# Patient Record
Sex: Female | Born: 1940 | Race: White | Hispanic: No | Marital: Married | Smoking: Former smoker
Health system: Southern US, Community
[De-identification: ages and names within clinical notes are randomized; demographics above are authoritative.]

## PROBLEM LIST (undated history)

## (undated) DIAGNOSIS — T7840XA Allergy, unspecified, initial encounter: Secondary | ICD-10-CM

## (undated) DIAGNOSIS — J45909 Unspecified asthma, uncomplicated: Secondary | ICD-10-CM

## (undated) DIAGNOSIS — D649 Anemia, unspecified: Secondary | ICD-10-CM

## (undated) DIAGNOSIS — M25562 Pain in left knee: Secondary | ICD-10-CM

## (undated) DIAGNOSIS — F329 Major depressive disorder, single episode, unspecified: Secondary | ICD-10-CM

## (undated) DIAGNOSIS — F32A Depression, unspecified: Secondary | ICD-10-CM

## (undated) DIAGNOSIS — I10 Essential (primary) hypertension: Secondary | ICD-10-CM

## (undated) DIAGNOSIS — E7800 Pure hypercholesterolemia, unspecified: Secondary | ICD-10-CM

## (undated) DIAGNOSIS — N189 Chronic kidney disease, unspecified: Secondary | ICD-10-CM

## (undated) DIAGNOSIS — K219 Gastro-esophageal reflux disease without esophagitis: Secondary | ICD-10-CM

## (undated) DIAGNOSIS — C4490 Unspecified malignant neoplasm of skin, unspecified: Secondary | ICD-10-CM

## (undated) DIAGNOSIS — M1990 Unspecified osteoarthritis, unspecified site: Secondary | ICD-10-CM

## (undated) HISTORY — PX: ABDOMINAL HYSTERECTOMY: SHX81

## (undated) HISTORY — PX: TONSILLECTOMY: SUR1361

---
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
## 1966-06-30 HISTORY — PX: GASTRECTOMY: SHX58

## 1995-08-18 HISTORY — PX: BREAST BIOPSY: SHX20

## 2004-12-26 ENCOUNTER — Ambulatory Visit: Payer: Self-pay | Admitting: Internal Medicine

## 2006-01-06 ENCOUNTER — Ambulatory Visit: Payer: Self-pay | Admitting: Internal Medicine

## 2006-01-14 ENCOUNTER — Ambulatory Visit: Payer: Self-pay | Admitting: Internal Medicine

## 2006-07-20 ENCOUNTER — Ambulatory Visit: Payer: Self-pay | Admitting: Internal Medicine

## 2007-07-26 ENCOUNTER — Ambulatory Visit: Payer: Self-pay | Admitting: Internal Medicine

## 2007-08-26 ENCOUNTER — Ambulatory Visit: Payer: Self-pay | Admitting: Gastroenterology

## 2008-07-27 ENCOUNTER — Ambulatory Visit: Payer: Self-pay | Admitting: Internal Medicine

## 2009-07-30 ENCOUNTER — Ambulatory Visit: Payer: Self-pay | Admitting: Internal Medicine

## 2010-07-11 ENCOUNTER — Emergency Department: Payer: Self-pay | Admitting: Unknown Physician Specialty

## 2010-07-31 ENCOUNTER — Ambulatory Visit: Payer: Self-pay | Admitting: Internal Medicine

## 2010-08-08 ENCOUNTER — Ambulatory Visit: Payer: Self-pay | Admitting: Gastroenterology

## 2010-09-12 ENCOUNTER — Ambulatory Visit: Payer: Self-pay | Admitting: Gastroenterology

## 2011-08-08 ENCOUNTER — Ambulatory Visit: Payer: Self-pay | Admitting: Internal Medicine

## 2012-08-10 ENCOUNTER — Ambulatory Visit: Payer: Self-pay | Admitting: Internal Medicine

## 2012-09-04 ENCOUNTER — Ambulatory Visit: Payer: Self-pay | Admitting: Orthopedic Surgery

## 2012-09-04 ENCOUNTER — Emergency Department: Payer: Self-pay | Admitting: Emergency Medicine

## 2013-05-06 ENCOUNTER — Ambulatory Visit: Payer: Self-pay | Admitting: Otolaryngology

## 2013-08-11 ENCOUNTER — Ambulatory Visit: Payer: Self-pay | Admitting: Internal Medicine

## 2013-12-10 DIAGNOSIS — L309 Dermatitis, unspecified: Secondary | ICD-10-CM | POA: Insufficient documentation

## 2013-12-10 DIAGNOSIS — I129 Hypertensive chronic kidney disease with stage 1 through stage 4 chronic kidney disease, or unspecified chronic kidney disease: Secondary | ICD-10-CM | POA: Insufficient documentation

## 2013-12-10 DIAGNOSIS — E781 Pure hyperglyceridemia: Secondary | ICD-10-CM | POA: Insufficient documentation

## 2013-12-10 DIAGNOSIS — J45909 Unspecified asthma, uncomplicated: Secondary | ICD-10-CM | POA: Insufficient documentation

## 2014-06-03 DIAGNOSIS — F325 Major depressive disorder, single episode, in full remission: Secondary | ICD-10-CM | POA: Insufficient documentation

## 2014-08-24 ENCOUNTER — Ambulatory Visit: Payer: Self-pay | Admitting: Internal Medicine

## 2014-10-20 NOTE — Consult Note (Signed)
PATIENT NAME:  Christina Bird, Christina Bird MR#:  [id number] DATE OF BIRTH:  11-13-1940  DATE OF CONSULTATION:  09/04/2012  REFERRING PHYSICIAN:  Emergency Room.   CONSULTING PHYSICIAN:  Claud Kelp, MD  HISTORY OF PRESENT ILLNESS:  She is a relatively healthy RHD 74 year old female who slipped and fell on her left outstretched hand earlier today and noted immediate pain, swelling and deformity about her left wrist. She presented to the Emergency Room for further evaluation.    FAMILY AND SOCIAL HISTORY:  Noncontributory.  REVIEW OF SYSTEMS:  Negative for any nausea, vomiting, fevers, chills, shortness of breath or chest pain.  PHYSICAL EXAMINATION:  She had a notable dorsal deformity about her left wrist. She had intact sensation to all her digits, was able to flex and extend all fingers, and she had brisk capillary refill to all digits. She also had palpable radial and ulnar pulses to her left hand. She has no tenderness to palpation about her forearm, elbow or shoulder. She had painless range of motion of her elbow and shoulder.   Radiographs taken demonstrated a distal radius fracture with dorsal angulation displacement.   ASSESSMENT:  A 74 year old female with a left distal radius fracture.   PLAN:  The patient was counseled regarding the risks and benefits of closed reduction and splinting. She was also counseled that the reduction may not be able to be maintained. She will need serial radiographs to make sure she maintains her reduction and is able to manage with further nonoperative management versus the potential for the loss of reduction and need for surgical intervention. She agreed to proceed with closed reduction after this discussion. The patient was given a conscious sedation and distal radius reduction was performed. A sugar tong splint was applied, and post reduction films demonstrated adequate restoration of radial height and alignment. The patient will be discharged home. Follow up in one  week for repeat radiographic evaluation.   ____________________________ Claud Kelp, MD tte:jm D: 09/04/2012 13:01:03 ET T: 09/04/2012 13:34:10 ET JOB#: [id number]  cc: Claud Kelp, MD, <Dictator> Claud Kelp MD ELECTRONICALLY SIGNED 09/05/2012 8:54

## 2014-12-14 DIAGNOSIS — G43009 Migraine without aura, not intractable, without status migrainosus: Secondary | ICD-10-CM | POA: Insufficient documentation

## 2015-07-30 ENCOUNTER — Other Ambulatory Visit: Payer: Self-pay | Admitting: Internal Medicine

## 2015-07-30 DIAGNOSIS — Z1231 Encounter for screening mammogram for malignant neoplasm of breast: Secondary | ICD-10-CM

## 2015-08-30 ENCOUNTER — Other Ambulatory Visit: Payer: Self-pay | Admitting: Internal Medicine

## 2015-08-30 ENCOUNTER — Ambulatory Visit
Admission: RE | Admit: 2015-08-30 | Discharge: 2015-08-30 | Disposition: A | Discharge status: Home / Self Care | Payer: PPO | Source: Ambulatory Visit | Attending: Internal Medicine | Admitting: Internal Medicine

## 2015-08-30 DIAGNOSIS — Z1231 Encounter for screening mammogram for malignant neoplasm of breast: Secondary | ICD-10-CM | POA: Diagnosis not present

## 2015-11-15 DIAGNOSIS — L820 Inflamed seborrheic keratosis: Secondary | ICD-10-CM | POA: Diagnosis not present

## 2015-11-15 DIAGNOSIS — X32XXXA Exposure to sunlight, initial encounter: Secondary | ICD-10-CM | POA: Diagnosis not present

## 2015-11-15 DIAGNOSIS — L570 Actinic keratosis: Secondary | ICD-10-CM | POA: Diagnosis not present

## 2015-11-15 DIAGNOSIS — I821 Thrombophlebitis migrans: Secondary | ICD-10-CM | POA: Diagnosis not present

## 2015-11-15 DIAGNOSIS — Z85828 Personal history of other malignant neoplasm of skin: Secondary | ICD-10-CM | POA: Diagnosis not present

## 2015-11-15 DIAGNOSIS — Z08 Encounter for follow-up examination after completed treatment for malignant neoplasm: Secondary | ICD-10-CM | POA: Diagnosis not present

## 2015-11-15 DIAGNOSIS — D485 Neoplasm of uncertain behavior of skin: Secondary | ICD-10-CM | POA: Diagnosis not present

## 2015-12-13 DIAGNOSIS — I129 Hypertensive chronic kidney disease with stage 1 through stage 4 chronic kidney disease, or unspecified chronic kidney disease: Secondary | ICD-10-CM | POA: Diagnosis not present

## 2015-12-13 DIAGNOSIS — E781 Pure hyperglyceridemia: Secondary | ICD-10-CM | POA: Diagnosis not present

## 2015-12-13 DIAGNOSIS — N183 Chronic kidney disease, stage 3 (moderate): Secondary | ICD-10-CM | POA: Diagnosis not present

## 2015-12-20 DIAGNOSIS — J4520 Mild intermittent asthma, uncomplicated: Secondary | ICD-10-CM | POA: Diagnosis not present

## 2015-12-20 DIAGNOSIS — F325 Major depressive disorder, single episode, in full remission: Secondary | ICD-10-CM | POA: Diagnosis not present

## 2015-12-20 DIAGNOSIS — K219 Gastro-esophageal reflux disease without esophagitis: Secondary | ICD-10-CM | POA: Diagnosis not present

## 2015-12-20 DIAGNOSIS — N183 Chronic kidney disease, stage 3 (moderate): Secondary | ICD-10-CM | POA: Diagnosis not present

## 2015-12-20 DIAGNOSIS — E781 Pure hyperglyceridemia: Secondary | ICD-10-CM | POA: Diagnosis not present

## 2015-12-20 DIAGNOSIS — Z79899 Other long term (current) drug therapy: Secondary | ICD-10-CM | POA: Diagnosis not present

## 2015-12-20 DIAGNOSIS — I129 Hypertensive chronic kidney disease with stage 1 through stage 4 chronic kidney disease, or unspecified chronic kidney disease: Secondary | ICD-10-CM | POA: Diagnosis not present

## 2016-03-16 DIAGNOSIS — H6981 Other specified disorders of Eustachian tube, right ear: Secondary | ICD-10-CM | POA: Diagnosis not present

## 2016-04-05 DIAGNOSIS — J209 Acute bronchitis, unspecified: Secondary | ICD-10-CM | POA: Diagnosis not present

## 2016-04-10 DIAGNOSIS — Z23 Encounter for immunization: Secondary | ICD-10-CM | POA: Diagnosis not present

## 2016-04-11 DIAGNOSIS — H2513 Age-related nuclear cataract, bilateral: Secondary | ICD-10-CM | POA: Diagnosis not present

## 2016-05-11 DIAGNOSIS — R05 Cough: Secondary | ICD-10-CM | POA: Diagnosis not present

## 2016-05-11 DIAGNOSIS — J4541 Moderate persistent asthma with (acute) exacerbation: Secondary | ICD-10-CM | POA: Diagnosis not present

## 2016-05-21 DIAGNOSIS — J4521 Mild intermittent asthma with (acute) exacerbation: Secondary | ICD-10-CM | POA: Diagnosis not present

## 2016-06-19 DIAGNOSIS — I129 Hypertensive chronic kidney disease with stage 1 through stage 4 chronic kidney disease, or unspecified chronic kidney disease: Secondary | ICD-10-CM | POA: Diagnosis not present

## 2016-06-19 DIAGNOSIS — F325 Major depressive disorder, single episode, in full remission: Secondary | ICD-10-CM | POA: Diagnosis not present

## 2016-06-19 DIAGNOSIS — N183 Chronic kidney disease, stage 3 (moderate): Secondary | ICD-10-CM | POA: Diagnosis not present

## 2016-06-19 DIAGNOSIS — K219 Gastro-esophageal reflux disease without esophagitis: Secondary | ICD-10-CM | POA: Diagnosis not present

## 2016-06-19 DIAGNOSIS — E781 Pure hyperglyceridemia: Secondary | ICD-10-CM | POA: Diagnosis not present

## 2016-06-19 DIAGNOSIS — Z79899 Other long term (current) drug therapy: Secondary | ICD-10-CM | POA: Diagnosis not present

## 2016-06-26 DIAGNOSIS — E538 Deficiency of other specified B group vitamins: Secondary | ICD-10-CM | POA: Insufficient documentation

## 2016-06-26 DIAGNOSIS — E781 Pure hyperglyceridemia: Secondary | ICD-10-CM | POA: Diagnosis not present

## 2016-06-26 DIAGNOSIS — F325 Major depressive disorder, single episode, in full remission: Secondary | ICD-10-CM | POA: Diagnosis not present

## 2016-06-26 DIAGNOSIS — I129 Hypertensive chronic kidney disease with stage 1 through stage 4 chronic kidney disease, or unspecified chronic kidney disease: Secondary | ICD-10-CM | POA: Diagnosis not present

## 2016-06-26 DIAGNOSIS — Z0000 Encounter for general adult medical examination without abnormal findings: Secondary | ICD-10-CM | POA: Diagnosis not present

## 2016-06-26 DIAGNOSIS — N183 Chronic kidney disease, stage 3 (moderate): Secondary | ICD-10-CM | POA: Diagnosis not present

## 2016-08-05 ENCOUNTER — Other Ambulatory Visit: Payer: Self-pay | Admitting: Internal Medicine

## 2016-08-05 DIAGNOSIS — Z1231 Encounter for screening mammogram for malignant neoplasm of breast: Secondary | ICD-10-CM

## 2016-08-21 DIAGNOSIS — L820 Inflamed seborrheic keratosis: Secondary | ICD-10-CM | POA: Diagnosis not present

## 2016-08-21 DIAGNOSIS — L538 Other specified erythematous conditions: Secondary | ICD-10-CM | POA: Diagnosis not present

## 2016-08-21 DIAGNOSIS — Z85828 Personal history of other malignant neoplasm of skin: Secondary | ICD-10-CM | POA: Diagnosis not present

## 2016-08-21 DIAGNOSIS — L570 Actinic keratosis: Secondary | ICD-10-CM | POA: Diagnosis not present

## 2016-08-21 DIAGNOSIS — D225 Melanocytic nevi of trunk: Secondary | ICD-10-CM | POA: Diagnosis not present

## 2016-08-21 DIAGNOSIS — X32XXXA Exposure to sunlight, initial encounter: Secondary | ICD-10-CM | POA: Diagnosis not present

## 2016-08-21 DIAGNOSIS — L821 Other seborrheic keratosis: Secondary | ICD-10-CM | POA: Diagnosis not present

## 2016-08-21 DIAGNOSIS — D2261 Melanocytic nevi of right upper limb, including shoulder: Secondary | ICD-10-CM | POA: Diagnosis not present

## 2016-09-04 ENCOUNTER — Ambulatory Visit
Admission: RE | Admit: 2016-09-04 | Discharge: 2016-09-04 | Disposition: A | Discharge status: Home / Self Care | Payer: PPO | Source: Ambulatory Visit | Attending: Internal Medicine | Admitting: Internal Medicine

## 2016-09-04 DIAGNOSIS — Z1231 Encounter for screening mammogram for malignant neoplasm of breast: Secondary | ICD-10-CM | POA: Diagnosis not present

## 2016-09-10 DIAGNOSIS — J209 Acute bronchitis, unspecified: Secondary | ICD-10-CM | POA: Diagnosis not present

## 2016-12-06 DIAGNOSIS — L255 Unspecified contact dermatitis due to plants, except food: Secondary | ICD-10-CM | POA: Diagnosis not present

## 2016-12-11 DIAGNOSIS — F325 Major depressive disorder, single episode, in full remission: Secondary | ICD-10-CM | POA: Diagnosis not present

## 2016-12-11 DIAGNOSIS — N183 Chronic kidney disease, stage 3 (moderate): Secondary | ICD-10-CM | POA: Diagnosis not present

## 2016-12-11 DIAGNOSIS — I129 Hypertensive chronic kidney disease with stage 1 through stage 4 chronic kidney disease, or unspecified chronic kidney disease: Secondary | ICD-10-CM | POA: Diagnosis not present

## 2016-12-11 DIAGNOSIS — E781 Pure hyperglyceridemia: Secondary | ICD-10-CM | POA: Diagnosis not present

## 2016-12-25 DIAGNOSIS — E781 Pure hyperglyceridemia: Secondary | ICD-10-CM | POA: Diagnosis not present

## 2016-12-25 DIAGNOSIS — E538 Deficiency of other specified B group vitamins: Secondary | ICD-10-CM | POA: Diagnosis not present

## 2016-12-25 DIAGNOSIS — I129 Hypertensive chronic kidney disease with stage 1 through stage 4 chronic kidney disease, or unspecified chronic kidney disease: Secondary | ICD-10-CM | POA: Diagnosis not present

## 2016-12-25 DIAGNOSIS — F325 Major depressive disorder, single episode, in full remission: Secondary | ICD-10-CM | POA: Diagnosis not present

## 2016-12-25 DIAGNOSIS — N183 Chronic kidney disease, stage 3 (moderate): Secondary | ICD-10-CM | POA: Diagnosis not present

## 2016-12-25 DIAGNOSIS — J4541 Moderate persistent asthma with (acute) exacerbation: Secondary | ICD-10-CM | POA: Diagnosis not present

## 2017-04-02 DIAGNOSIS — Z23 Encounter for immunization: Secondary | ICD-10-CM | POA: Diagnosis not present

## 2017-04-23 DIAGNOSIS — E538 Deficiency of other specified B group vitamins: Secondary | ICD-10-CM | POA: Diagnosis not present

## 2017-04-23 DIAGNOSIS — E781 Pure hyperglyceridemia: Secondary | ICD-10-CM | POA: Diagnosis not present

## 2017-04-23 DIAGNOSIS — Z0000 Encounter for general adult medical examination without abnormal findings: Secondary | ICD-10-CM | POA: Diagnosis not present

## 2017-04-23 DIAGNOSIS — F325 Major depressive disorder, single episode, in full remission: Secondary | ICD-10-CM | POA: Diagnosis not present

## 2017-04-23 DIAGNOSIS — N183 Chronic kidney disease, stage 3 (moderate): Secondary | ICD-10-CM | POA: Diagnosis not present

## 2017-04-23 DIAGNOSIS — I129 Hypertensive chronic kidney disease with stage 1 through stage 4 chronic kidney disease, or unspecified chronic kidney disease: Secondary | ICD-10-CM | POA: Diagnosis not present

## 2017-04-23 DIAGNOSIS — J4541 Moderate persistent asthma with (acute) exacerbation: Secondary | ICD-10-CM | POA: Diagnosis not present

## 2017-06-25 DIAGNOSIS — E781 Pure hyperglyceridemia: Secondary | ICD-10-CM | POA: Diagnosis not present

## 2017-06-25 DIAGNOSIS — N183 Chronic kidney disease, stage 3 (moderate): Secondary | ICD-10-CM | POA: Diagnosis not present

## 2017-06-25 DIAGNOSIS — I129 Hypertensive chronic kidney disease with stage 1 through stage 4 chronic kidney disease, or unspecified chronic kidney disease: Secondary | ICD-10-CM | POA: Diagnosis not present

## 2017-06-25 DIAGNOSIS — E538 Deficiency of other specified B group vitamins: Secondary | ICD-10-CM | POA: Diagnosis not present

## 2017-07-02 DIAGNOSIS — I129 Hypertensive chronic kidney disease with stage 1 through stage 4 chronic kidney disease, or unspecified chronic kidney disease: Secondary | ICD-10-CM | POA: Diagnosis not present

## 2017-07-02 DIAGNOSIS — N183 Chronic kidney disease, stage 3 (moderate): Secondary | ICD-10-CM | POA: Diagnosis not present

## 2017-07-02 DIAGNOSIS — E538 Deficiency of other specified B group vitamins: Secondary | ICD-10-CM | POA: Diagnosis not present

## 2017-07-02 DIAGNOSIS — K219 Gastro-esophageal reflux disease without esophagitis: Secondary | ICD-10-CM | POA: Diagnosis not present

## 2017-07-02 DIAGNOSIS — Z0000 Encounter for general adult medical examination without abnormal findings: Secondary | ICD-10-CM | POA: Diagnosis not present

## 2017-07-02 DIAGNOSIS — J4541 Moderate persistent asthma with (acute) exacerbation: Secondary | ICD-10-CM | POA: Diagnosis not present

## 2017-07-02 DIAGNOSIS — Z1231 Encounter for screening mammogram for malignant neoplasm of breast: Secondary | ICD-10-CM | POA: Diagnosis not present

## 2017-07-02 DIAGNOSIS — E781 Pure hyperglyceridemia: Secondary | ICD-10-CM | POA: Diagnosis not present

## 2017-07-02 DIAGNOSIS — F325 Major depressive disorder, single episode, in full remission: Secondary | ICD-10-CM | POA: Diagnosis not present

## 2017-08-05 ENCOUNTER — Other Ambulatory Visit: Payer: Self-pay | Admitting: Internal Medicine

## 2017-08-05 DIAGNOSIS — Z1231 Encounter for screening mammogram for malignant neoplasm of breast: Secondary | ICD-10-CM

## 2017-09-10 ENCOUNTER — Ambulatory Visit
Admission: RE | Admit: 2017-09-10 | Discharge: 2017-09-10 | Disposition: A | Discharge status: Home / Self Care | Payer: PPO | Source: Ambulatory Visit | Attending: Internal Medicine | Admitting: Internal Medicine

## 2017-09-10 DIAGNOSIS — Z1231 Encounter for screening mammogram for malignant neoplasm of breast: Secondary | ICD-10-CM

## 2017-10-29 DIAGNOSIS — D2262 Melanocytic nevi of left upper limb, including shoulder: Secondary | ICD-10-CM | POA: Diagnosis not present

## 2017-10-29 DIAGNOSIS — D2272 Melanocytic nevi of left lower limb, including hip: Secondary | ICD-10-CM | POA: Diagnosis not present

## 2017-10-29 DIAGNOSIS — D045 Carcinoma in situ of skin of trunk: Secondary | ICD-10-CM | POA: Diagnosis not present

## 2017-10-29 DIAGNOSIS — N183 Chronic kidney disease, stage 3 (moderate): Secondary | ICD-10-CM | POA: Diagnosis not present

## 2017-10-29 DIAGNOSIS — D485 Neoplasm of uncertain behavior of skin: Secondary | ICD-10-CM | POA: Diagnosis not present

## 2017-10-29 DIAGNOSIS — Z08 Encounter for follow-up examination after completed treatment for malignant neoplasm: Secondary | ICD-10-CM | POA: Diagnosis not present

## 2017-10-29 DIAGNOSIS — X32XXXA Exposure to sunlight, initial encounter: Secondary | ICD-10-CM | POA: Diagnosis not present

## 2017-10-29 DIAGNOSIS — D2261 Melanocytic nevi of right upper limb, including shoulder: Secondary | ICD-10-CM | POA: Diagnosis not present

## 2017-10-29 DIAGNOSIS — Z85828 Personal history of other malignant neoplasm of skin: Secondary | ICD-10-CM | POA: Diagnosis not present

## 2017-10-29 DIAGNOSIS — D225 Melanocytic nevi of trunk: Secondary | ICD-10-CM | POA: Diagnosis not present

## 2017-10-29 DIAGNOSIS — L821 Other seborrheic keratosis: Secondary | ICD-10-CM | POA: Diagnosis not present

## 2017-10-29 DIAGNOSIS — D0439 Carcinoma in situ of skin of other parts of face: Secondary | ICD-10-CM | POA: Diagnosis not present

## 2017-10-29 DIAGNOSIS — I129 Hypertensive chronic kidney disease with stage 1 through stage 4 chronic kidney disease, or unspecified chronic kidney disease: Secondary | ICD-10-CM | POA: Diagnosis not present

## 2017-10-29 DIAGNOSIS — D2271 Melanocytic nevi of right lower limb, including hip: Secondary | ICD-10-CM | POA: Diagnosis not present

## 2017-10-29 DIAGNOSIS — L570 Actinic keratosis: Secondary | ICD-10-CM | POA: Diagnosis not present

## 2017-11-22 DIAGNOSIS — R05 Cough: Secondary | ICD-10-CM | POA: Diagnosis not present

## 2017-11-22 DIAGNOSIS — J029 Acute pharyngitis, unspecified: Secondary | ICD-10-CM | POA: Diagnosis not present

## 2017-11-22 DIAGNOSIS — J019 Acute sinusitis, unspecified: Secondary | ICD-10-CM | POA: Diagnosis not present

## 2017-11-22 DIAGNOSIS — B9689 Other specified bacterial agents as the cause of diseases classified elsewhere: Secondary | ICD-10-CM | POA: Diagnosis not present

## 2017-12-03 DIAGNOSIS — H2513 Age-related nuclear cataract, bilateral: Secondary | ICD-10-CM | POA: Diagnosis not present

## 2017-12-24 DIAGNOSIS — D045 Carcinoma in situ of skin of trunk: Secondary | ICD-10-CM | POA: Diagnosis not present

## 2018-01-07 DIAGNOSIS — E781 Pure hyperglyceridemia: Secondary | ICD-10-CM | POA: Diagnosis not present

## 2018-01-07 DIAGNOSIS — I129 Hypertensive chronic kidney disease with stage 1 through stage 4 chronic kidney disease, or unspecified chronic kidney disease: Secondary | ICD-10-CM | POA: Diagnosis not present

## 2018-01-07 DIAGNOSIS — N183 Chronic kidney disease, stage 3 (moderate): Secondary | ICD-10-CM | POA: Diagnosis not present

## 2018-01-07 DIAGNOSIS — E538 Deficiency of other specified B group vitamins: Secondary | ICD-10-CM | POA: Diagnosis not present

## 2018-01-14 DIAGNOSIS — E538 Deficiency of other specified B group vitamins: Secondary | ICD-10-CM | POA: Diagnosis not present

## 2018-01-14 DIAGNOSIS — N183 Chronic kidney disease, stage 3 (moderate): Secondary | ICD-10-CM | POA: Diagnosis not present

## 2018-01-14 DIAGNOSIS — I129 Hypertensive chronic kidney disease with stage 1 through stage 4 chronic kidney disease, or unspecified chronic kidney disease: Secondary | ICD-10-CM | POA: Diagnosis not present

## 2018-01-14 DIAGNOSIS — F325 Major depressive disorder, single episode, in full remission: Secondary | ICD-10-CM | POA: Diagnosis not present

## 2018-01-14 DIAGNOSIS — E781 Pure hyperglyceridemia: Secondary | ICD-10-CM | POA: Diagnosis not present

## 2018-03-16 DIAGNOSIS — H938X1 Other specified disorders of right ear: Secondary | ICD-10-CM | POA: Diagnosis not present

## 2018-03-16 DIAGNOSIS — H6123 Impacted cerumen, bilateral: Secondary | ICD-10-CM | POA: Diagnosis not present

## 2018-04-08 DIAGNOSIS — Z23 Encounter for immunization: Secondary | ICD-10-CM | POA: Diagnosis not present

## 2018-04-29 DIAGNOSIS — Z85828 Personal history of other malignant neoplasm of skin: Secondary | ICD-10-CM | POA: Diagnosis not present

## 2018-04-29 DIAGNOSIS — Z08 Encounter for follow-up examination after completed treatment for malignant neoplasm: Secondary | ICD-10-CM | POA: Diagnosis not present

## 2018-04-29 DIAGNOSIS — L821 Other seborrheic keratosis: Secondary | ICD-10-CM | POA: Diagnosis not present

## 2018-04-29 DIAGNOSIS — D0439 Carcinoma in situ of skin of other parts of face: Secondary | ICD-10-CM | POA: Diagnosis not present

## 2018-04-29 DIAGNOSIS — L570 Actinic keratosis: Secondary | ICD-10-CM | POA: Diagnosis not present

## 2018-04-29 DIAGNOSIS — X32XXXA Exposure to sunlight, initial encounter: Secondary | ICD-10-CM | POA: Diagnosis not present

## 2018-05-19 DIAGNOSIS — B9689 Other specified bacterial agents as the cause of diseases classified elsewhere: Secondary | ICD-10-CM | POA: Diagnosis not present

## 2018-05-19 DIAGNOSIS — J019 Acute sinusitis, unspecified: Secondary | ICD-10-CM | POA: Diagnosis not present

## 2018-05-19 DIAGNOSIS — J209 Acute bronchitis, unspecified: Secondary | ICD-10-CM | POA: Diagnosis not present

## 2018-07-08 DIAGNOSIS — N183 Chronic kidney disease, stage 3 (moderate): Secondary | ICD-10-CM | POA: Diagnosis not present

## 2018-07-08 DIAGNOSIS — I129 Hypertensive chronic kidney disease with stage 1 through stage 4 chronic kidney disease, or unspecified chronic kidney disease: Secondary | ICD-10-CM | POA: Diagnosis not present

## 2018-07-08 DIAGNOSIS — F325 Major depressive disorder, single episode, in full remission: Secondary | ICD-10-CM | POA: Diagnosis not present

## 2018-07-08 DIAGNOSIS — E781 Pure hyperglyceridemia: Secondary | ICD-10-CM | POA: Diagnosis not present

## 2018-07-15 DIAGNOSIS — Z0000 Encounter for general adult medical examination without abnormal findings: Secondary | ICD-10-CM | POA: Diagnosis not present

## 2018-07-15 DIAGNOSIS — F325 Major depressive disorder, single episode, in full remission: Secondary | ICD-10-CM | POA: Diagnosis not present

## 2018-07-15 DIAGNOSIS — N183 Chronic kidney disease, stage 3 (moderate): Secondary | ICD-10-CM | POA: Diagnosis not present

## 2018-07-15 DIAGNOSIS — I129 Hypertensive chronic kidney disease with stage 1 through stage 4 chronic kidney disease, or unspecified chronic kidney disease: Secondary | ICD-10-CM | POA: Diagnosis not present

## 2018-07-15 DIAGNOSIS — E538 Deficiency of other specified B group vitamins: Secondary | ICD-10-CM | POA: Diagnosis not present

## 2018-07-15 DIAGNOSIS — E781 Pure hyperglyceridemia: Secondary | ICD-10-CM | POA: Diagnosis not present

## 2019-01-06 DIAGNOSIS — N183 Chronic kidney disease, stage 3 (moderate): Secondary | ICD-10-CM | POA: Diagnosis not present

## 2019-01-06 DIAGNOSIS — E538 Deficiency of other specified B group vitamins: Secondary | ICD-10-CM | POA: Diagnosis not present

## 2019-01-06 DIAGNOSIS — I129 Hypertensive chronic kidney disease with stage 1 through stage 4 chronic kidney disease, or unspecified chronic kidney disease: Secondary | ICD-10-CM | POA: Diagnosis not present

## 2019-01-06 DIAGNOSIS — E781 Pure hyperglyceridemia: Secondary | ICD-10-CM | POA: Diagnosis not present

## 2019-01-11 ENCOUNTER — Other Ambulatory Visit: Payer: Self-pay | Admitting: Internal Medicine

## 2019-01-11 DIAGNOSIS — Z1231 Encounter for screening mammogram for malignant neoplasm of breast: Secondary | ICD-10-CM

## 2019-01-13 DIAGNOSIS — I129 Hypertensive chronic kidney disease with stage 1 through stage 4 chronic kidney disease, or unspecified chronic kidney disease: Secondary | ICD-10-CM | POA: Diagnosis not present

## 2019-01-13 DIAGNOSIS — N183 Chronic kidney disease, stage 3 (moderate): Secondary | ICD-10-CM | POA: Diagnosis not present

## 2019-01-13 DIAGNOSIS — F325 Major depressive disorder, single episode, in full remission: Secondary | ICD-10-CM | POA: Diagnosis not present

## 2019-01-13 DIAGNOSIS — E781 Pure hyperglyceridemia: Secondary | ICD-10-CM | POA: Diagnosis not present

## 2019-01-13 DIAGNOSIS — K219 Gastro-esophageal reflux disease without esophagitis: Secondary | ICD-10-CM | POA: Diagnosis not present

## 2019-01-13 DIAGNOSIS — J4541 Moderate persistent asthma with (acute) exacerbation: Secondary | ICD-10-CM | POA: Diagnosis not present

## 2019-02-17 ENCOUNTER — Ambulatory Visit
Admission: RE | Admit: 2019-02-17 | Discharge: 2019-02-17 | Disposition: A | Discharge status: Home / Self Care | Payer: PPO | Source: Ambulatory Visit | Attending: Internal Medicine | Admitting: Internal Medicine

## 2019-02-17 ENCOUNTER — Other Ambulatory Visit: Payer: Self-pay

## 2019-02-17 DIAGNOSIS — Z1231 Encounter for screening mammogram for malignant neoplasm of breast: Secondary | ICD-10-CM

## 2019-03-23 DIAGNOSIS — Z23 Encounter for immunization: Secondary | ICD-10-CM | POA: Diagnosis not present

## 2019-03-25 DIAGNOSIS — D485 Neoplasm of uncertain behavior of skin: Secondary | ICD-10-CM | POA: Diagnosis not present

## 2019-03-25 DIAGNOSIS — R208 Other disturbances of skin sensation: Secondary | ICD-10-CM | POA: Diagnosis not present

## 2019-03-25 DIAGNOSIS — X32XXXA Exposure to sunlight, initial encounter: Secondary | ICD-10-CM | POA: Diagnosis not present

## 2019-03-25 DIAGNOSIS — L570 Actinic keratosis: Secondary | ICD-10-CM | POA: Diagnosis not present

## 2019-03-25 DIAGNOSIS — Z08 Encounter for follow-up examination after completed treatment for malignant neoplasm: Secondary | ICD-10-CM | POA: Diagnosis not present

## 2019-03-25 DIAGNOSIS — C44629 Squamous cell carcinoma of skin of left upper limb, including shoulder: Secondary | ICD-10-CM | POA: Diagnosis not present

## 2019-03-25 DIAGNOSIS — C44729 Squamous cell carcinoma of skin of left lower limb, including hip: Secondary | ICD-10-CM | POA: Diagnosis not present

## 2019-03-25 DIAGNOSIS — Z85828 Personal history of other malignant neoplasm of skin: Secondary | ICD-10-CM | POA: Diagnosis not present

## 2019-03-28 DIAGNOSIS — H353131 Nonexudative age-related macular degeneration, bilateral, early dry stage: Secondary | ICD-10-CM | POA: Diagnosis not present

## 2019-04-06 DIAGNOSIS — M25562 Pain in left knee: Secondary | ICD-10-CM | POA: Diagnosis not present

## 2019-04-08 DIAGNOSIS — H2511 Age-related nuclear cataract, right eye: Secondary | ICD-10-CM | POA: Diagnosis not present

## 2019-04-10 ENCOUNTER — Encounter: Payer: Self-pay | Admitting: Emergency Medicine

## 2019-04-10 ENCOUNTER — Emergency Department
Admission: EM | Admit: 2019-04-10 | Discharge: 2019-04-10 | Disposition: A | Discharge status: Home / Self Care | Payer: PPO | Attending: Emergency Medicine | Admitting: Emergency Medicine

## 2019-04-10 ENCOUNTER — Other Ambulatory Visit: Payer: Self-pay

## 2019-04-10 ENCOUNTER — Emergency Department: Payer: PPO

## 2019-04-10 DIAGNOSIS — I10 Essential (primary) hypertension: Secondary | ICD-10-CM | POA: Insufficient documentation

## 2019-04-10 DIAGNOSIS — M25562 Pain in left knee: Secondary | ICD-10-CM | POA: Diagnosis not present

## 2019-04-10 DIAGNOSIS — R102 Pelvic and perineal pain: Secondary | ICD-10-CM | POA: Diagnosis not present

## 2019-04-10 HISTORY — DX: Essential (primary) hypertension: I10

## 2019-04-10 LAB — CBC WITH DIFFERENTIAL/PLATELET
Abs Immature Granulocytes: 0.01 10*3/uL (ref 0.00–0.07)
Basophils Absolute: 0.1 10*3/uL (ref 0.0–0.1)
Basophils Relative: 1 %
Eosinophils Absolute: 0.2 10*3/uL (ref 0.0–0.5)
Eosinophils Relative: 4 %
HCT: 34.4 % — ABNORMAL LOW (ref 36.0–46.0)
Hemoglobin: 12 g/dL (ref 12.0–15.0)
Immature Granulocytes: 0 %
Lymphocytes Relative: 32 %
Lymphs Abs: 2 10*3/uL (ref 0.7–4.0)
MCH: 32.8 pg (ref 26.0–34.0)
MCHC: 34.9 g/dL (ref 30.0–36.0)
MCV: 94 fL (ref 80.0–100.0)
Monocytes Absolute: 0.5 10*3/uL (ref 0.1–1.0)
Monocytes Relative: 8 %
Neutro Abs: 3.5 10*3/uL (ref 1.7–7.7)
Neutrophils Relative %: 55 %
Platelets: 181 10*3/uL (ref 150–400)
RBC: 3.66 MIL/uL — ABNORMAL LOW (ref 3.87–5.11)
RDW: 11.6 % (ref 11.5–15.5)
WBC: 6.3 10*3/uL (ref 4.0–10.5)
nRBC: 0 % (ref 0.0–0.2)

## 2019-04-10 LAB — URIC ACID: Uric Acid, Serum: 5.8 mg/dL (ref 2.5–7.1)

## 2019-04-10 MED ORDER — TRAMADOL HCL 50 MG PO TABS: 50.0000 mg | ORAL_TABLET | Freq: Four times a day (QID) | ORAL | 0 refills | Status: DC | PRN

## 2019-04-10 MED ORDER — TRAMADOL HCL 50 MG PO TABS
50.0000 mg | ORAL_TABLET | Freq: Once | ORAL | Status: AC
  Administered 2019-04-10: 50 mg via ORAL
  Filled 2019-04-10: qty 1

## 2019-04-10 NOTE — ED Triage Notes (Signed)
C/O left knee pain.  Seen by PCP Wednesday for same, given NSAID ointment, no effect.  Patient referred to Ortho, has appointment on Tuesday.  C/O left knee pain.  Denies injury.

## 2019-04-10 NOTE — Discharge Instructions (Signed)
Follow-up with Dr. Candelaria Stagers on Tuesday for your already scheduled appointment.  Wear the knee brace to help decrease inflammation.  He can still apply ice to the knee.  Take tramadol for pain as needed.  Use a walker when at home to help support you and not put as much pressure on the knee.

## 2019-04-10 NOTE — ED Provider Notes (Signed)
[facility] Emergency Department Provider Note  ____________________________________________   First MD Initiated Contact with Patient 04/10/19 1321     (approximate)  I have reviewed the triage vital signs and the nursing notes.   HISTORY  Chief Complaint Knee Pain    HPI Christina Bird is a 78 y.o. female presents emergency department complaining of left knee pain.  Was seen by her PCP who gave her Voltaren cream which is not helping.  She states she has an appointment with orthopedics on Tuesday but is unable to bear weight and help her husband who is in a wheelchair.  She denies any hip pain.  No falls or injuries.    Past Medical History:  Diagnosis Date  . Hypertension     There are no active problems to display for this patient.   Past Surgical History:  Procedure Laterality Date  . BREAST BIOPSY Left 08/18/1995   neg/stereotactic biopsy    Prior to Admission medications   Medication Sig Start Date End Date Taking? Authorizing Provider  traMADol (ULTRAM) 50 MG tablet Take 1 tablet (50 mg total) by mouth every 6 (six) hours as needed. 04/10/19   Versie Starks, PA-C    Allergies Patient has no known allergies.  No family history on file.  Social History Social History   Tobacco Use  . Smoking status: Never Smoker  . Smokeless tobacco: Never Used  Substance Use Topics  . Alcohol use: Not on file  . Drug use: Not on file    Review of Systems  Constitutional: No fever/chills Eyes: No visual changes. ENT: No sore throat. Respiratory: Denies cough Genitourinary: Negative for dysuria. Musculoskeletal: Negative for back pain.  Positive for left knee pain Skin: Negative for rash.    ____________________________________________   PHYSICAL EXAM:  VITAL SIGNS: ED Triage Vitals  Enc Vitals Group     BP 04/10/19 1310 (!) 152/72     Pulse Rate 04/10/19 1310 72     Resp 04/10/19 1310 17     Temp 04/10/19 1310 98.4 F  (36.9 C)     Temp Source 04/10/19 1310 Oral     SpO2 04/10/19 1310 96 %     Weight 04/10/19 1259 140 lb (63.5 kg)     Height 04/10/19 1259 5\' 6"  (1.676 m)     Head Circumference --      Peak Flow --      Pain Score 04/10/19 1258 6     Pain Loc --      Pain Edu? --      Excl. in [place]? --     Constitutional: Alert and oriented. Well appearing and in no acute distress. Eyes: Conjunctivae are normal.  Head: Atraumatic. Nose: No congestion/rhinnorhea. Mouth/Throat: Mucous membranes are moist.   Neck:  supple no lymphadenopathy noted Cardiovascular: Normal rate, regular rhythm.  Respiratory: Normal respiratory effort.  No retractions,  GU: deferred Musculoskeletal: FROM all extremities, warm and well perfused, left knee is tender at the medial aspect, knee feels warm to touch, neurovascular is intact, she is able to extend and flex without difficulty. Neurologic:  Normal speech and language.  Skin:  Skin is warm, dry and intact. No rash noted. Psychiatric: Mood and affect are normal. Speech and behavior are normal.  ____________________________________________   LABS (all labs ordered are listed, but only abnormal results are displayed)  Labs Reviewed  CBC WITH DIFFERENTIAL/PLATELET - Abnormal; Notable for the following components:      Result Value  RBC 3.66 (*)    HCT 34.4 (*)    All other components within normal limits  URIC ACID   ____________________________________________   ____________________________________________  RADIOLOGY  X-ray of the left knee shows a benign growth at the medial aspect, x-ray of the pelvis is normal  ____________________________________________   PROCEDURES  Procedure(s) performed: Tramadol 1 p.o., knee immobilizer   Procedures    ____________________________________________   INITIAL IMPRESSION / ASSESSMENT AND PLAN / ED COURSE  Pertinent labs & imaging results that were available during my care of the patient were reviewed  by me and considered in my medical decision making (see chart for details).   Patient is a 78 year old female presents emergency with left knee pain.  See HPI  Physical exam shows the left knee to be tender medial aspect.  X-ray of the left knee shows a benign growth at the medial aspect.  X-ray of the pelvis shows normal hips.  Explained findings to the patient.  She is placed in a knee immobilizer.  Given tramadol 1 p.o. here in the ED.  Prescription for tramadol sent to her pharmacy.  She is to follow-up with orthopedics on Tuesday for her already scheduled appointment.  She states she understands the treatment plan and will comply.  She is to wear the knee immobilizer and use a walker to decrease the amount of pressure on the knee.  She is to continue to apply ice.    Christina Bird was evaluated in Emergency Department on 04/10/2019 for the symptoms described in the history of present illness. She was evaluated in the context of the global COVID-19 pandemic, which necessitated consideration that the patient might be at risk for infection with the SARS-CoV-2 virus that causes COVID-19. Institutional protocols and algorithms that pertain to the evaluation of patients at risk for COVID-19 are in a state of rapid change based on information released by regulatory bodies including the CDC and federal and state organizations. These policies and algorithms were followed during the patient's care in the ED.   As part of my medical decision making, I reviewed the following data within the [place] notes reviewed and incorporated, Old chart reviewed, Radiograph reviewed x-ray of the left knee shows a benign growth, x-ray of the pelvis is negative, Notes from prior ED visits and [place] Controlled Substance Database  ____________________________________________   FINAL CLINICAL IMPRESSION(S) / ED DIAGNOSES  Final diagnoses:  Acute pain of left knee      NEW MEDICATIONS  STARTED DURING THIS VISIT:  Discharge Medication List as of 04/10/2019  3:13 PM    START taking these medications   Details  traMADol (ULTRAM) 50 MG tablet Take 1 tablet (50 mg total) by mouth every 6 (six) hours as needed., Starting Sun 04/10/2019, Normal         Note:  This document was prepared using Dragon voice recognition software and may include unintentional dictation errors.    Versie Starks, PA-C 04/10/19 1644    Lavonia Drafts, MD 04/10/19 1723

## 2019-04-12 DIAGNOSIS — M898X6 Other specified disorders of bone, lower leg: Secondary | ICD-10-CM | POA: Diagnosis not present

## 2019-04-12 DIAGNOSIS — M1712 Unilateral primary osteoarthritis, left knee: Secondary | ICD-10-CM | POA: Diagnosis not present

## 2019-04-12 DIAGNOSIS — M25562 Pain in left knee: Secondary | ICD-10-CM | POA: Diagnosis not present

## 2019-04-12 DIAGNOSIS — M25462 Effusion, left knee: Secondary | ICD-10-CM | POA: Diagnosis not present

## 2019-04-13 ENCOUNTER — Encounter: Payer: Self-pay | Admitting: *Deleted

## 2019-04-13 ENCOUNTER — Encounter: Payer: Self-pay | Admitting: Anesthesiology

## 2019-04-13 ENCOUNTER — Other Ambulatory Visit: Payer: Self-pay

## 2019-04-14 NOTE — Anesthesia Preprocedure Evaluation (Deleted)
Anesthesia Evaluation    Airway        Dental   Pulmonary former smoker,           Cardiovascular hypertension,    HLD   Neuro/Psych PSYCHIATRIC DISORDERS Depression    GI/Hepatic GERD  ,  Endo/Other    Renal/GU CRFRenal disease     Musculoskeletal  (+) Arthritis ,   Abdominal   Peds  Hematology  (+) Blood dyscrasia, anemia ,   Anesthesia Other Findings Skin cancer   Reproductive/Obstetrics                             Anesthesia Physical Anesthesia Plan  ASA: II  Anesthesia Plan: MAC   Post-op Pain Management:    Induction: Intravenous  PONV Risk Score and Plan: 2 and TIVA and Midazolam  Airway Management Planned: Nasal Cannula  Additional Equipment:   Intra-op Plan:   Post-operative Plan:   Informed Consent: I have reviewed the patients History and Physical, chart, labs and discussed the procedure including the risks, benefits and alternatives for the proposed anesthesia with the patient or authorized representative who has indicated his/her understanding and acceptance.       Plan Discussed with: CRNA and Anesthesiologist  Anesthesia Plan Comments:         Anesthesia Quick Evaluation

## 2019-04-15 ENCOUNTER — Other Ambulatory Visit: Payer: Self-pay

## 2019-04-15 ENCOUNTER — Other Ambulatory Visit
Admission: RE | Admit: 2019-04-15 | Discharge: 2019-04-15 | Disposition: A | Discharge status: Home / Self Care | Payer: PPO | Source: Ambulatory Visit | Attending: Ophthalmology | Admitting: Ophthalmology

## 2019-04-15 DIAGNOSIS — Z20828 Contact with and (suspected) exposure to other viral communicable diseases: Secondary | ICD-10-CM | POA: Diagnosis not present

## 2019-04-15 LAB — SARS CORONAVIRUS 2 (TAT 6-24 HRS): SARS Coronavirus 2: NEGATIVE

## 2019-04-18 ENCOUNTER — Other Ambulatory Visit: Payer: Self-pay | Admitting: Sports Medicine

## 2019-04-18 DIAGNOSIS — M25462 Effusion, left knee: Secondary | ICD-10-CM

## 2019-04-18 DIAGNOSIS — M25562 Pain in left knee: Secondary | ICD-10-CM

## 2019-04-20 ENCOUNTER — Ambulatory Visit: Admit: 2019-04-20 | Payer: PPO | Admitting: Ophthalmology

## 2019-04-20 HISTORY — DX: Chronic kidney disease, unspecified: N18.9

## 2019-04-20 HISTORY — DX: Depression, unspecified: F32.A

## 2019-04-20 HISTORY — DX: Unspecified malignant neoplasm of skin, unspecified: C44.90

## 2019-04-20 HISTORY — DX: Pure hypercholesterolemia, unspecified: E78.00

## 2019-04-20 HISTORY — DX: Allergy, unspecified, initial encounter: T78.40XA

## 2019-04-20 HISTORY — DX: Anemia, unspecified: D64.9

## 2019-04-20 HISTORY — DX: Unspecified osteoarthritis, unspecified site: M19.90

## 2019-04-20 HISTORY — DX: Gastro-esophageal reflux disease without esophagitis: K21.9

## 2019-04-20 HISTORY — DX: Pain in left knee: M25.562

## 2019-04-20 SURGERY — PHACOEMULSIFICATION, CATARACT, WITH IOL INSERTION
Anesthesia: Topical | Laterality: Right

## 2019-04-23 ENCOUNTER — Ambulatory Visit
Admission: RE | Admit: 2019-04-23 | Discharge: 2019-04-23 | Disposition: A | Discharge status: Home / Self Care | Payer: PPO | Source: Ambulatory Visit | Attending: Sports Medicine | Admitting: Sports Medicine

## 2019-04-23 DIAGNOSIS — M25562 Pain in left knee: Secondary | ICD-10-CM | POA: Diagnosis not present

## 2019-04-23 DIAGNOSIS — M25462 Effusion, left knee: Secondary | ICD-10-CM

## 2019-04-28 DIAGNOSIS — M1712 Unilateral primary osteoarthritis, left knee: Secondary | ICD-10-CM | POA: Diagnosis not present

## 2019-04-28 DIAGNOSIS — M7052 Other bursitis of knee, left knee: Secondary | ICD-10-CM | POA: Diagnosis not present

## 2019-04-28 DIAGNOSIS — M25562 Pain in left knee: Secondary | ICD-10-CM | POA: Diagnosis not present

## 2019-04-28 DIAGNOSIS — S83242D Other tear of medial meniscus, current injury, left knee, subsequent encounter: Secondary | ICD-10-CM | POA: Diagnosis not present

## 2019-04-28 DIAGNOSIS — M25462 Effusion, left knee: Secondary | ICD-10-CM | POA: Diagnosis not present

## 2019-04-28 DIAGNOSIS — M84453A Pathological fracture, unspecified femur, initial encounter for fracture: Secondary | ICD-10-CM | POA: Diagnosis not present

## 2019-04-28 DIAGNOSIS — M7122 Synovial cyst of popliteal space [Baker], left knee: Secondary | ICD-10-CM | POA: Diagnosis not present

## 2019-05-25 DIAGNOSIS — S83242D Other tear of medial meniscus, current injury, left knee, subsequent encounter: Secondary | ICD-10-CM | POA: Diagnosis not present

## 2019-05-25 DIAGNOSIS — M7052 Other bursitis of knee, left knee: Secondary | ICD-10-CM | POA: Diagnosis not present

## 2019-05-25 DIAGNOSIS — M7122 Synovial cyst of popliteal space [Baker], left knee: Secondary | ICD-10-CM | POA: Diagnosis not present

## 2019-05-25 DIAGNOSIS — M25562 Pain in left knee: Secondary | ICD-10-CM | POA: Diagnosis not present

## 2019-05-25 DIAGNOSIS — M84453A Pathological fracture, unspecified femur, initial encounter for fracture: Secondary | ICD-10-CM | POA: Diagnosis not present

## 2019-05-25 DIAGNOSIS — M25462 Effusion, left knee: Secondary | ICD-10-CM | POA: Diagnosis not present

## 2019-05-25 DIAGNOSIS — M1712 Unilateral primary osteoarthritis, left knee: Secondary | ICD-10-CM | POA: Diagnosis not present

## 2019-06-02 DIAGNOSIS — M2392 Unspecified internal derangement of left knee: Secondary | ICD-10-CM | POA: Diagnosis not present

## 2019-06-28 ENCOUNTER — Encounter
Admission: RE | Admit: 2019-06-28 | Discharge: 2019-06-28 | Disposition: A | Discharge status: Home / Self Care | Payer: PPO | Source: Ambulatory Visit | Attending: Orthopedic Surgery | Admitting: Orthopedic Surgery

## 2019-06-28 ENCOUNTER — Other Ambulatory Visit: Payer: Self-pay

## 2019-06-28 HISTORY — DX: Unspecified asthma, uncomplicated: J45.909

## 2019-06-28 NOTE — Patient Instructions (Signed)
Your procedure is scheduled on: Monday 07/04/19 Report to [place]. To find out your arrival time please call [phone] between 1PM - 3PM on Thursday 06/30/19.  Remember: Instructions that are not followed completely may result in serious medical risk, up to and including death, or upon the discretion of your surgeon and anesthesiologist your surgery may need to be rescheduled.     _X__ 1. Do not eat food after midnight the night before your procedure.                 No gum chewing or hard candies. You may drink clear liquids up to 2 hours                 before you are scheduled to arrive for your surgery- DO not drink clear                 liquids within 2 hours of the start of your surgery.                 Clear Liquids include:  water, apple juice without pulp, clear carbohydrate                 drink such as Clearfast or Gatorade, Black Coffee or Tea (Do not add                 anything to coffee or tea). Diabetics water only  __X__2.  On the morning of surgery brush your teeth with toothpaste and water, you                 may rinse your mouth with mouthwash if you wish.  Do not swallow any              toothpaste of mouthwash.     _X__ 3.  No Alcohol for 24 hours before or after surgery.   _X__ 4.  Do Not Smoke or use e-cigarettes For 24 Hours Prior to Your Surgery.                 Do not use any chewable tobacco products for at least 6 hours prior to                 surgery.  ____  5.  Bring all medications with you on the day of surgery if instructed.   __X__  6.  Notify your doctor if there is any change in your medical condition      (cold, fever, infections).     Do not wear jewelry, make-up, hairpins, clips or nail polish. Do not wear lotions, powders, or perfumes.  Do not shave 48 hours prior to surgery. Men may shave face and neck. Do not bring valuables to the hospital.    [facility] is not responsible for  any belongings or valuables.  Contacts, dentures/partials or body piercings may not be worn into surgery. Bring a case for your contacts, glasses or hearing aids, a denture cup will be supplied. Leave your suitcase in the car. After surgery it may be brought to your room. For patients admitted to the hospital, discharge time is determined by your treatment team.   Patients discharged the day of surgery will not be allowed to drive home.   Please read over the following fact sheets that you were given:   MRSA Information  __X__ Take these medicines the morning of surgery with A SIP OF WATER:  1. Wellbutrin  2. Protonix  3. Crestor  4.  5.  6.  ____ Fleet Enema (as directed)   __X__ Use CHG Soap/SAGE wipes as directed  __X__ Use inhalers on the day of surgery  ____ Stop metformin/Janumet/Farxiga 2 days prior to surgery    ____ Take 1/2 of usual insulin dose the night before surgery. No insulin the morning          of surgery.   ____ Stop Blood Thinners Coumadin/Plavix/Xarelto/Pleta/Pradaxa/Eliquis/Effient/Aspirin  on   Or contact your Surgeon, Cardiologist or Medical Doctor regarding  ability to stop your blood thinners  __X__ Stop Anti-inflammatories 7 days before surgery such as Advil, Ibuprofen, Motrin,  BC or Goodies Powder, Naprosyn, Naproxen, Aleve, Aspirin    __X__ Stop all herbal supplements, fish oil or vitamin E until after surgery.    ____ Bring C-Pap to the hospital.

## 2019-06-29 ENCOUNTER — Encounter
Admission: RE | Admit: 2019-06-29 | Discharge: 2019-06-29 | Disposition: A | Discharge status: Home / Self Care | Payer: PPO | Source: Ambulatory Visit | Attending: Orthopedic Surgery | Admitting: Orthopedic Surgery

## 2019-06-29 DIAGNOSIS — E785 Hyperlipidemia, unspecified: Secondary | ICD-10-CM | POA: Diagnosis not present

## 2019-06-29 DIAGNOSIS — I10 Essential (primary) hypertension: Secondary | ICD-10-CM | POA: Insufficient documentation

## 2019-06-30 ENCOUNTER — Other Ambulatory Visit
Admission: RE | Admit: 2019-06-30 | Discharge: 2019-06-30 | Disposition: A | Discharge status: Home / Self Care | Payer: PPO | Source: Ambulatory Visit | Attending: Orthopedic Surgery | Admitting: Orthopedic Surgery

## 2019-06-30 ENCOUNTER — Other Ambulatory Visit: Payer: Self-pay

## 2019-06-30 DIAGNOSIS — Z20828 Contact with and (suspected) exposure to other viral communicable diseases: Secondary | ICD-10-CM | POA: Insufficient documentation

## 2019-06-30 DIAGNOSIS — Z01812 Encounter for preprocedural laboratory examination: Secondary | ICD-10-CM | POA: Diagnosis not present

## 2019-07-01 LAB — SARS CORONAVIRUS 2 (TAT 6-24 HRS): SARS Coronavirus 2: NEGATIVE

## 2019-07-04 ENCOUNTER — Encounter: Payer: Self-pay | Admitting: Orthopedic Surgery

## 2019-07-04 ENCOUNTER — Ambulatory Visit: Payer: PPO | Admitting: Anesthesiology

## 2019-07-04 ENCOUNTER — Other Ambulatory Visit: Payer: Self-pay

## 2019-07-04 ENCOUNTER — Encounter
Admission: RE | Disposition: A | Discharge status: Home / Self Care | Payer: Self-pay | Source: Ambulatory Visit | Attending: Orthopedic Surgery

## 2019-07-04 ENCOUNTER — Ambulatory Visit
Admission: RE | Admit: 2019-07-04 | Discharge: 2019-07-04 | Disposition: A | Discharge status: Home / Self Care | Payer: PPO | Source: Ambulatory Visit | Attending: Orthopedic Surgery | Admitting: Orthopedic Surgery

## 2019-07-04 DIAGNOSIS — I129 Hypertensive chronic kidney disease with stage 1 through stage 4 chronic kidney disease, or unspecified chronic kidney disease: Secondary | ICD-10-CM | POA: Insufficient documentation

## 2019-07-04 DIAGNOSIS — Z87891 Personal history of nicotine dependence: Secondary | ICD-10-CM | POA: Diagnosis not present

## 2019-07-04 DIAGNOSIS — Z903 Acquired absence of stomach [part of]: Secondary | ICD-10-CM | POA: Insufficient documentation

## 2019-07-04 DIAGNOSIS — S83242A Other tear of medial meniscus, current injury, left knee, initial encounter: Secondary | ICD-10-CM | POA: Insufficient documentation

## 2019-07-04 DIAGNOSIS — K219 Gastro-esophageal reflux disease without esophagitis: Secondary | ICD-10-CM | POA: Insufficient documentation

## 2019-07-04 DIAGNOSIS — N189 Chronic kidney disease, unspecified: Secondary | ICD-10-CM | POA: Diagnosis not present

## 2019-07-04 DIAGNOSIS — M23222 Derangement of posterior horn of medial meniscus due to old tear or injury, left knee: Secondary | ICD-10-CM | POA: Diagnosis not present

## 2019-07-04 DIAGNOSIS — F329 Major depressive disorder, single episode, unspecified: Secondary | ICD-10-CM | POA: Diagnosis not present

## 2019-07-04 DIAGNOSIS — Z7951 Long term (current) use of inhaled steroids: Secondary | ICD-10-CM | POA: Insufficient documentation

## 2019-07-04 DIAGNOSIS — E538 Deficiency of other specified B group vitamins: Secondary | ICD-10-CM | POA: Insufficient documentation

## 2019-07-04 DIAGNOSIS — J45909 Unspecified asthma, uncomplicated: Secondary | ICD-10-CM | POA: Insufficient documentation

## 2019-07-04 DIAGNOSIS — M2392 Unspecified internal derangement of left knee: Secondary | ICD-10-CM | POA: Diagnosis not present

## 2019-07-04 DIAGNOSIS — E7800 Pure hypercholesterolemia, unspecified: Secondary | ICD-10-CM | POA: Diagnosis not present

## 2019-07-04 DIAGNOSIS — X58XXXA Exposure to other specified factors, initial encounter: Secondary | ICD-10-CM | POA: Diagnosis not present

## 2019-07-04 DIAGNOSIS — M94262 Chondromalacia, left knee: Secondary | ICD-10-CM | POA: Insufficient documentation

## 2019-07-04 DIAGNOSIS — Z9889 Other specified postprocedural states: Secondary | ICD-10-CM

## 2019-07-04 DIAGNOSIS — Z85828 Personal history of other malignant neoplasm of skin: Secondary | ICD-10-CM | POA: Insufficient documentation

## 2019-07-04 DIAGNOSIS — D631 Anemia in chronic kidney disease: Secondary | ICD-10-CM | POA: Insufficient documentation

## 2019-07-04 HISTORY — PX: KNEE ARTHROSCOPY: SHX127

## 2019-07-04 SURGERY — ARTHROSCOPY, KNEE
Anesthesia: General | Site: Knee | Laterality: Left

## 2019-07-04 MED ORDER — HYDROCODONE-ACETAMINOPHEN 5-325 MG PO TABS: 1.0000 | ORAL_TABLET | ORAL | 0 refills | Status: DC | PRN

## 2019-07-04 MED ORDER — MORPHINE SULFATE (PF) 4 MG/ML IV SOLN
INTRAVENOUS | Status: AC
  Filled 2019-07-04: qty 1

## 2019-07-04 MED ORDER — MIDAZOLAM HCL 2 MG/2ML IJ SOLN
INTRAMUSCULAR | Status: DC | PRN
  Administered 2019-07-04: 1 mg via INTRAVENOUS

## 2019-07-04 MED ORDER — LACTATED RINGERS IV SOLN: INTRAVENOUS | Status: DC

## 2019-07-04 MED ORDER — EPINEPHRINE PF 1 MG/ML IJ SOLN
INTRAMUSCULAR | Status: AC
  Filled 2019-07-04: qty 1

## 2019-07-04 MED ORDER — BUPIVACAINE HCL (PF) 0.25 % IJ SOLN
INTRAMUSCULAR | Status: AC
  Filled 2019-07-04: qty 30

## 2019-07-04 MED ORDER — ONDANSETRON HCL 4 MG/2ML IJ SOLN
INTRAMUSCULAR | Status: DC | PRN
  Administered 2019-07-04: 4 mg via INTRAVENOUS

## 2019-07-04 MED ORDER — GLYCOPYRROLATE 0.2 MG/ML IJ SOLN
INTRAMUSCULAR | Status: DC | PRN
  Administered 2019-07-04: .2 mg via INTRAVENOUS

## 2019-07-04 MED ORDER — PROPOFOL 10 MG/ML IV BOLUS
INTRAVENOUS | Status: DC | PRN
  Administered 2019-07-04: 30 mg via INTRAVENOUS
  Administered 2019-07-04: 100 mg via INTRAVENOUS
  Administered 2019-07-04 (×2): 20 mg via INTRAVENOUS

## 2019-07-04 MED ORDER — CHLORHEXIDINE GLUCONATE 4 % EX LIQD: 60.0000 mL | Freq: Once | CUTANEOUS | Status: DC

## 2019-07-04 MED ORDER — GLYCOPYRROLATE 0.2 MG/ML IJ SOLN
INTRAMUSCULAR | Status: AC
  Filled 2019-07-04: qty 1

## 2019-07-04 MED ORDER — FENTANYL CITRATE (PF) 100 MCG/2ML IJ SOLN
25.0000 ug | INTRAMUSCULAR | Status: DC | PRN
  Administered 2019-07-04 (×3): 25 ug via INTRAVENOUS

## 2019-07-04 MED ORDER — LIDOCAINE HCL (CARDIAC) PF 100 MG/5ML IV SOSY
PREFILLED_SYRINGE | INTRAVENOUS | Status: DC | PRN
  Administered 2019-07-04: 100 mg via INTRAVENOUS

## 2019-07-04 MED ORDER — BUPIVACAINE-EPINEPHRINE 0.25% -1:200000 IJ SOLN
INTRAMUSCULAR | Status: DC | PRN
  Administered 2019-07-04: 5 mL
  Administered 2019-07-04: 25 mL

## 2019-07-04 MED ORDER — PROPOFOL 10 MG/ML IV BOLUS
INTRAVENOUS | Status: AC
  Filled 2019-07-04: qty 20

## 2019-07-04 MED ORDER — CELECOXIB 200 MG PO CAPS: 400.0000 mg | ORAL_CAPSULE | Freq: Once | ORAL | Status: AC

## 2019-07-04 MED ORDER — OXYCODONE HCL 5 MG PO TABS: 5.0000 mg | ORAL_TABLET | Freq: Once | ORAL | Status: DC | PRN

## 2019-07-04 MED ORDER — MIDAZOLAM HCL 2 MG/2ML IJ SOLN
INTRAMUSCULAR | Status: AC
  Filled 2019-07-04: qty 2

## 2019-07-04 MED ORDER — ONDANSETRON HCL 4 MG/2ML IJ SOLN
INTRAMUSCULAR | Status: AC
  Filled 2019-07-04: qty 2

## 2019-07-04 MED ORDER — PHENYLEPHRINE HCL (PRESSORS) 10 MG/ML IV SOLN
INTRAVENOUS | Status: DC | PRN
  Administered 2019-07-04 (×5): 100 ug via INTRAVENOUS

## 2019-07-04 MED ORDER — MORPHINE SULFATE 4 MG/ML IJ SOLN
INTRAMUSCULAR | Status: DC | PRN
  Administered 2019-07-04: 4 mg via INTRAVENOUS

## 2019-07-04 MED ORDER — OXYCODONE HCL 5 MG/5ML PO SOLN: 5.0000 mg | Freq: Once | ORAL | Status: DC | PRN

## 2019-07-04 MED ORDER — FENTANYL CITRATE (PF) 100 MCG/2ML IJ SOLN
INTRAMUSCULAR | Status: AC
  Administered 2019-07-04: 14:00:00 25 ug via INTRAVENOUS
  Filled 2019-07-04: qty 2

## 2019-07-04 MED ORDER — CELECOXIB 200 MG PO CAPS
ORAL_CAPSULE | ORAL | Status: AC
  Administered 2019-07-04: 11:00:00 400 mg via ORAL
  Filled 2019-07-04: qty 2

## 2019-07-04 MED ORDER — FENTANYL CITRATE (PF) 100 MCG/2ML IJ SOLN
INTRAMUSCULAR | Status: DC | PRN
  Administered 2019-07-04 (×4): 25 ug via INTRAVENOUS

## 2019-07-04 MED ORDER — ACETAMINOPHEN 10 MG/ML IV SOLN
INTRAVENOUS | Status: AC
  Filled 2019-07-04: qty 100

## 2019-07-04 MED ORDER — FENTANYL CITRATE (PF) 100 MCG/2ML IJ SOLN
INTRAMUSCULAR | Status: AC
  Filled 2019-07-04: qty 2

## 2019-07-04 SURGICAL SUPPLY — 30 items
ADAPTER IRRIG TUBE 2 SPIKE SOL (ADAPTER) ×4 IMPLANT
BLADE SHAVER 4.5 DBL SERAT CV (CUTTER) ×2 IMPLANT
COVER WAND RF STERILE (DRAPES) ×3 IMPLANT
CUFF TOURN SGL QUICK 24 (TOURNIQUET CUFF) ×2
CUFF TRNQT CYL 24X4X16.5-23 (TOURNIQUET CUFF) IMPLANT
DRSG DERMACEA 8X12 NADH (GAUZE/BANDAGES/DRESSINGS) ×3 IMPLANT
DURAPREP 26ML APPLICATOR (WOUND CARE) ×6 IMPLANT
GAUZE SPONGE 4X4 12PLY STRL (GAUZE/BANDAGES/DRESSINGS) ×3 IMPLANT
GLOVE BIOGEL M STRL SZ7.5 (GLOVE) ×3 IMPLANT
GLOVE INDICATOR 8.0 STRL GRN (GLOVE) ×3 IMPLANT
GOWN STRL REUS W/ TWL LRG LVL3 (GOWN DISPOSABLE) ×2 IMPLANT
GOWN STRL REUS W/TWL LRG LVL3 (GOWN DISPOSABLE) ×4
IV LACTATED RINGER IRRG 3000ML (IV SOLUTION) ×12
IV LR IRRIG 3000ML ARTHROMATIC (IV SOLUTION) ×6 IMPLANT
KIT TURNOVER KIT A (KITS) ×3 IMPLANT
MANIFOLD NEPTUNE II (INSTRUMENTS) ×3 IMPLANT
PACK ARTHROSCOPY KNEE (MISCELLANEOUS) ×3 IMPLANT
PAD CAST CTTN 4X4 STRL (SOFTGOODS) IMPLANT
PADDING CAST COTTON 4X4 STRL (SOFTGOODS) ×2
SET TUBE SUCT SHAVER OUTFL 24K (TUBING) ×3 IMPLANT
SET TUBE TIP INTRA-ARTICULAR (MISCELLANEOUS) ×3 IMPLANT
SLEEVE PROTECTION STRL DISP (MISCELLANEOUS) ×2 IMPLANT
SOL PREP PVP 2OZ (MISCELLANEOUS) ×3
SOLUTION PREP PVP 2OZ (MISCELLANEOUS) ×1 IMPLANT
STOCKINETTE BIAS CUT 4 980044 (GAUZE/BANDAGES/DRESSINGS) ×2 IMPLANT
SUT ETHILON 3-0 FS-10 30 BLK (SUTURE) ×3
SUTURE EHLN 3-0 FS-10 30 BLK (SUTURE) ×1 IMPLANT
TUBING ARTHRO INFLOW-ONLY STRL (TUBING) ×3 IMPLANT
WAND HAND CNTRL MULTIVAC 50 (MISCELLANEOUS) ×3 IMPLANT
WRAP KNEE W/COLD PACKS 25.5X14 (SOFTGOODS) ×3 IMPLANT

## 2019-07-04 NOTE — Discharge Instructions (Signed)
AMBULATORY SURGERY  DISCHARGE INSTRUCTIONS   1) The drugs that you were given will stay in your system until tomorrow so for the next 24 hours you should not:  A) Drive an automobile B) Make any legal decisions C) Drink any alcoholic beverage   2) You may resume regular meals tomorrow.  Today it is better to start with liquids and gradually work up to solid foods.  You may eat anything you prefer, but it is better to start with liquids, then soup and crackers, and gradually work up to solid foods.   3) Please notify your doctor immediately if you have any unusual bleeding, trouble breathing, redness and pain at the surgery site, drainage, fever, or pain not relieved by medication.    4) Additional Instructions:        Please contact your physician with any problems or Same Day Surgery at [phone], Monday through Friday 6 am to 4 pm, or [place] at [facility] number at [phone].AMBULATORY SURGERY  DISCHARGE INSTRUCTIONS   5) The drugs that you were given will stay in your system until tomorrow so for the next 24 hours you should not:  D) Drive an automobile E) Make any legal decisions F) Drink any alcoholic beverage   6) You may resume regular meals tomorrow.  Today it is better to start with liquids and gradually work up to solid foods.  You may eat anything you prefer, but it is better to start with liquids, then soup and crackers, and gradually work up to solid foods.   7) Please notify your doctor immediately if you have any unusual bleeding, trouble breathing, redness and pain at the surgery site, drainage, fever, or pain not relieved by medication.    8) Additional Instructions:        Please contact your physician with any problems or Same Day Surgery at [phone], Monday through Friday 6 am to 4 pm, or [place] at [facility] number at [phone]. Instructions after Knee Arthroscopy    James P. Holley Bouche., M.D.     Dept. of  [facility]  [place], [place]  [address]   Phone: [phone]   Fax: [phone]   DIET: . Drink plenty of non-alcoholic fluids & begin a light diet. Marland Kitchen Resume your normal diet the day after surgery.  ACTIVITY:  . You may use crutches or a walker with weight-bearing as tolerated, unless instructed otherwise. . You may wean yourself off of the walker or crutches as tolerated.  . Begin doing gentle exercises. Exercising will reduce the pain and swelling, increase motion, and prevent muscle weakness.   . Avoid strenuous activities or athletics for a minimum of 4-6 weeks after arthroscopic surgery. . Do not drive or operate any equipment until instructed.  WOUND CARE:  . Place one to two pillows under the knee the first day or two when sitting or lying.  . Continue to use the ice packs periodically to reduce pain and swelling. . The small incisions in your knee are closed with nylon stitches. The stitches will be removed in the office. . The bulky dressing may be removed on the second day after surgery. DO NOT TOUCH THE STITCHES. Put a Band-Aid over each stitch. Do NOT use any ointments or creams on the incisions.  . You may bathe or shower after the stitches are removed at the first office visit following surgery.  MEDICATIONS: . Dennis Bast may resume your regular  medications. . Please take the pain medication as prescribed. . Do not take pain medication on an empty stomach. . Do not drive or drink alcoholic beverages when taking pain medications.  CALL THE OFFICE FOR: . Temperature above 101 degrees . Excessive bleeding or drainage on the dressing. . Excessive swelling, coldness, or paleness of the toes. . Persistent nausea and vomiting.  FOLLOW-UP:  . You should have an appointment to return to the office in 7-10 days after surgery.       [facility] Department Directory         [email or web address]          Cardiology  Appointments: [place] [phone]  Endocrinology  Appointments: [place] [phone] [place] [phone]  Gastroenterology  Appointments: [place] [phone] [place] [employer] [phone]        General Surgery   Appointments: [facility]  Internal Medicine/Family Medicine  Appointments: [facility] [place] - [phone] [place] [phone]  Metabolic and [place] Loss Surgery  Appointments: [facility]        Neurology  Appointments: [place] [phone] [place] - [phone]  Neurosurgery  Appointments: [place]  Obstetrics & Gynecology  Appointments: [place] [phone] [place] - [phone]        Pediatrics  Appointments: [place] [phone] [place] - [phone]  Physiatry  Appointments: [place] [phone]  Physical Therapy  Appointments: [place] [phone]        Podiatry  Appointments: [place] [phone] [place] - [phone]  Pulmonology  Appointments: [place]  Rheumatology  Appointments: [place] [phone]        [place] Location: [facility]  [place], [place]  [address]  [place] Location: [facility] [address] [place], [place]  [address]  [place] Location: [facility] [address] [place], [place]  [id number]

## 2019-07-04 NOTE — H&P (Signed)
The patient has been re-examined, and the chart reviewed, and there have been no interval changes to the documented history and physical.    The risks, benefits, and alternatives have been discussed at length. The patient expressed understanding of the risks benefits and agreed with plans for surgical intervention.  Mak Bonny P. Taletha Twiford, Jr. M.D.    

## 2019-07-04 NOTE — Anesthesia Preprocedure Evaluation (Addendum)
Anesthesia Evaluation  Patient identified by MRN, date of birth, ID band Patient awake    Reviewed: Allergy & Precautions, H&P , NPO status , Patient's Chart, lab work & pertinent test results  History of Anesthesia Complications Negative for: history of anesthetic complications  Airway Mallampati: II  TM Distance: >3 FB Neck ROM: full    Dental   Pulmonary asthma , neg recent URI, former smoker,           Cardiovascular hypertension, (-) angina(-) Past MI and (-) Cardiac Stents (-) dysrhythmias      Neuro/Psych  Headaches, PSYCHIATRIC DISORDERS Depression    GI/Hepatic Neg liver ROS, GERD  Controlled,  Endo/Other  negative endocrine ROS  Renal/GU CRFRenal disease     Musculoskeletal  (+) Arthritis ,   Abdominal   Peds  Hematology negative hematology ROS (+)   Anesthesia Other Findings Past Medical History: No date: Allergies No date: Anemia No date: Arthritis No date: Asthma No date: Chronic kidney disease No date: Depression No date: GERD (gastroesophageal reflux disease) No date: Hypercholesteremia No date: Hypertension No date: Knee pain, left No date: Skin cancer  Past Surgical History: No date: ABDOMINAL HYSTERECTOMY 08/18/1995: BREAST BIOPSY; Left     Comment:  neg/stereotactic biopsy 1968: GASTRECTOMY     Comment:  lower part of stomach and part duodenum ?? No date: TONSILLECTOMY     Reproductive/Obstetrics negative OB ROS                            Anesthesia Physical Anesthesia Plan  ASA: II  Anesthesia Plan: General LMA   Post-op Pain Management:    Induction:   PONV Risk Score and Plan: Dexamethasone, Ondansetron, Midazolam and Treatment may vary due to age or medical condition  Airway Management Planned:   Additional Equipment:   Intra-op Plan:   Post-operative Plan:   Informed Consent: I have reviewed the patients History and Physical, chart, labs  and discussed the procedure including the risks, benefits and alternatives for the proposed anesthesia with the patient or authorized representative who has indicated his/her understanding and acceptance.     Dental Advisory Given  Plan Discussed with: Anesthesiologist  Anesthesia Plan Comments:        Anesthesia Quick Evaluation

## 2019-07-04 NOTE — Anesthesia Postprocedure Evaluation (Signed)
Anesthesia Post Note  Patient: Christina Bird  Procedure(s) Performed: ARTHROSCOPY KNEE (Left Knee)  Patient location during evaluation: PACU Anesthesia Type: General Level of consciousness: awake and alert Pain management: pain level controlled Vital Signs Assessment: post-procedure vital signs reviewed and stable Respiratory status: spontaneous breathing, nonlabored ventilation and respiratory function stable Cardiovascular status: blood pressure returned to baseline and stable Postop Assessment: no apparent nausea or vomiting Anesthetic complications: no     Last Vitals:  Vitals:   07/04/19 1421 07/04/19 1431  BP: (!) 138/93 138/63  Pulse: 74 77  Resp: 19 16  Temp:  36.6 C  SpO2: 94% 96%    Last Pain:  Vitals:   07/04/19 1431  TempSrc:   PainSc: [place]

## 2019-07-04 NOTE — Anesthesia Procedure Notes (Signed)
Procedure Name: LMA Insertion Date/Time: 06/12/2020 2:00 PM Performed by: Bernardo Heater, CRNA Pre-anesthesia Checklist: Patient identified, Patient being monitored, Timeout performed, Emergency Drugs available and Suction available Patient Re-evaluated:Patient Re-evaluated prior to induction Oxygen Delivery Method: Circle system utilized Preoxygenation: Pre-oxygenation with 100% oxygen Induction Type: IV induction Ventilation: Mask ventilation without difficulty LMA: LMA inserted LMA Size: 4.0 Tube type: Oral Number of attempts: 1 Placement Confirmation: positive ETCO2 and breath sounds checked- equal and bilateral Tube secured with: Tape Dental Injury: Teeth and Oropharynx as per pre-operative assessment

## 2019-07-04 NOTE — Op Note (Signed)
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   ASSISTANT: none  ANESTHESIA: general  ESTIMATED BLOOD LOSS: Minimal  FLUIDS REPLACED: 700 mL of crystalloid  TOURNIQUET TIME: Not used  INDICATIONS FOR SURGERY: HENRINE SIM is a 79 y.o. year old female who has been seen for complaints of left knee pain. MRI demonstrated findings consistent with meniscal pathology. After discussion of the risks and benefits of surgical intervention, the patient expressed understanding of the risks benefits and agree with plans for left knee arthroscopy.   PROCEDURE IN DETAIL: The patient was brought into the operating room and, after adequate general anesthesia was achieved, a tourniquet was applied to the left thigh and the leg was placed in the leg holder. All bony prominences were well padded. The patient's left knee was cleaned and prepped with alcohol and Duraprep and draped in the usual sterile fashion. A "timeout" was performed as per usual protocol. The anticipated portal sites were injected with 0.25% Marcaine with epinephrine. An anterolateral incision was made and a cannula was inserted. A small effusion was evacuated and the knee was distended with fluid using the pump. The scope was advanced down the medial gutter into the medial compartment. Under visualization with the scope, an anteromedial portal was created and a hooked probe was inserted. The medial meniscus was visualized and probed.  There was a complex tear involving the posterior horn of the medial meniscus.  The tear was debrided  using meniscal punches and a 4.5 mm incisor shaver.  Final contouring was performed using the 50 degree ArthroCare wand.  The articular cartilage was visualized.  There was a localized area grade 2 to early grade III chondromalacia involving the medial aspect of the medial femoral condyle.  This area was debrided and contoured using the ArthroCare wand.  The scope was then advanced into the intercondylar notch. The anterior cruciate ligament was visualized and probed and felt to be intact. The scope was removed from the lateral portal and reinserted via the anteromedial portal to better visualize the lateral compartment. The lateral meniscus was visualized and probed.  The lateral meniscus was intact.  The articular cartilage of the lateral compartment was visualized and was noted to be in excellent condition.  Finally, the scope was advanced so as to visualize the patellofemoral articulation. Good patellar tracking was appreciated.  Only mild chondral changes were noted to the articular surface.  The knee was irrigated with copius amounts of fluid and suctioned dry. The anterolateral portal was re-approximated with #3-0 nylon. A combination of 0.25% Marcaine with epinephrine and 4 mg of Morphine were injected via the scope. The scope was removed and the anteromedial portal was re-approximated with #3-0 nylon. A sterile dressing was applied followed by application of an ice wrap.  The patient tolerated the procedure well and was transported to the PACU in stable condition.  Anagabriela Jokerst P. Holley Bouche., M.D.

## 2019-07-04 NOTE — H&P (Signed)
ORTHOPAEDIC HISTORY & PHYSICAL Progress Notes by Gwenlyn Fudge, PA at 06/28/2019 2:45 PM  [facility] Chief Complaint:       Chief Complaint  Patient presents with  . Knee Pain    H & P LEFT KNEE    History of Present Illness:    Christina Bird is a 79 y.o. female that presents to clinic today for her preoperative history and evaluation.  Patient presents unaccompanied. The patient is scheduled to undergo a left  knee arthroscopy on 07/03/18 by Dr. Marry Guan. Her pain began in mid-October without any particular inciting event.   The pain is located medial and posterior aspect of the knee.  Pain is worse with pivoting and twisting activities. She reports associated swelling and some giving way of the knee.  She denies associated numbness, tingling, or locking of the knee.  Patient symptoms have not improved despite activity modification, Tylenol, NSAIDs, topical NSAIDs, and corticosteroid injections.    Past Medical, Surgical, Family, Social History, Allergies, Medications:   Past Medical History:      Past Medical History:  Diagnosis Date  . Allergic state   . Anemia   . Asthma without status asthmaticus, unspecified   . B12 deficiency   . Diverticulosis   . Fibrocystic breast disease   . History of endometriosis   . History of migraine    and tension headaches  . History of squamous cell carcinoma   . Hyperlipidemia   . Hypertension   . Hypertriglyceridemia   . PUD (peptic ulcer disease)     Past Surgical History:       Past Surgical History:  Procedure Laterality Date  . HYSTERECTOMY    . PARTIAL ESOPHAGECTOMY ABDOMINAL W/WO GASTRECTOMY W/ESOPHAGOGASTROSTOMY  1968   3/4 removed  . TONSILLECTOMY      Current Medications:  Current Medications        Current Outpatient Medications  Medication Sig Dispense Refill  . cyanocobalamin (VITAMIN B12) 1000 MCG tablet Take 1,000 mcg by  mouth once daily    . acetaminophen (TYLENOL ARTHRITIS PAIN) 650 MG ER tablet Take 1,300 mg by mouth every 8 (eight) hours as needed for Pain    . ADVAIR DISKUS 250-50 mcg/dose diskus inhaler Inhale 1 inhalation into the lungs every 12 (twelve) hours 1 Inhaler 0  . amLODIPine-valsartan (EXFORGE) 5-160 mg tablet Take 1 tablet by mouth once daily 90 tablet 0  . buPROPion (WELLBUTRIN SR) 150 MG SR tablet Take 1 tablet (150 mg total) by mouth 2 (two) times daily Refill when called 180 tablet 3  . cetirizine (ZYRTEC) 10 MG tablet Take 10 mg by mouth once daily    . cholecalciferol (VITAMIN D3) 2,000 unit tablet Take 2,000 Units by mouth once daily.    . ferrous sulfate 325 (65 FE) MG tablet Take 325 mg by mouth daily with breakfast Takes 5 times a week    . montelukast (SINGULAIR) 10 mg tablet Take 1 tablet (10 mg total) by mouth nightly Refill when she calls 90 tablet 3  . pantoprazole (PROTONIX) 40 MG DR tablet Take 1 tablet (40 mg total) by mouth once daily 90 tablet 0  . rosuvastatin (CRESTOR) 20 MG tablet Take 1 tablet (20 mg total) by mouth once daily 90 tablet 3   No current facility-administered medications for this visit.       Allergies:       Allergies  Allergen Reactions  . Cefdinir Diarrhea  Severe diarrhea  . Ibuprofen Other (See Comments)    Stomach pain  . Naproxen (Bulk) Other (See Comments)    Stomach pain    Social History:  Social History  Social History        Socioeconomic History  . Marital status: Married    Spouse name: MALOREE BOAS  . Number of children: Not on file  . Years of education: 59  . Highest education level: Not on file  Occupational History  . Occupation: Part-time-Office/desk  Social Needs  . Financial resource strain: Not on file  . Food insecurity    Worry: Not on file    Inability: Not on file  . Transportation needs    Medical: Not on file    Non-medical: Not on file  Tobacco Use  . Smoking  status: Former Smoker    Packs/day: 0.50    Types: Cigarettes    Quit date: 03/26/2013    Years since quitting: 6.2  . Smokeless tobacco: Never Used  Substance and Sexual Activity  . Alcohol use: No    Alcohol/week: 0.0 standard drinks  . Drug use: Never  . Sexual activity: Defer    Partners: Male  Lifestyle  . Physical activity    Days per week: Not on file    Minutes per session: Not on file  . Stress: Not on file  Relationships  . Social Herbalist on phone: Not on file    Gets together: Not on file    Attends religious service: Not on file    Active member of club or organization: Not on file    Attends meetings of clubs or organizations: Not on file    Relationship status: Not on file  Other Topics Concern  . Not on file  Social History Narrative  . Not on file      Family History:       Family History  Problem Relation Age of Onset  . Stroke Mother   . Alcohol abuse Father   . High blood pressure (Hypertension) Sister   . High blood pressure (Hypertension) Sister     Review of Systems:   A 10+ ROS was performed, reviewed, and the pertinent orthopaedic findings are documented in the HPI.    Physical Examination:   BP 130/80   Ht 170.2 cm (5\' 7" )   Wt 69.1 kg (152 lb 6.4 oz)   BMI 23.87 kg/m   Patient is a well-developed, well-nourished female in no acute distress. Patient has normal mood and affect. Patient is alert and oriented to person, place, and time.   HEENT: Atraumatic, normocephalic.  Pupils equal and reactive to light.  Extraocular motion intact.  Noninjected sclera.  Cardiovascular: Regular rate and rhythm, with no murmurs, rubs, or gallops.  Distal pulses palpable.  Respiratory: Lungs clear to auscultation bilaterally.   LeftKnee: Soft tissue  swelling:minimal Effusion:none Erythema:none Crepitance:none Tenderness:medial Alignment:normal Mediolateral laxity:stable Anterior drawer test:negative Lachman`s test:negative McMurray`s test:positive Atrophy:No significantatrophy.  Quadriceps tone was fair to good. Range of Motion:Greater than 120degrees   Sensation intact over the saphenous, lateral sural cutaneous, superficial fibular, and deep fibular nerve distributions.  Tests Performed/Reviewed:  X-rays  No new radiographs were obtained today. Previous radiographs were reviewed of the left knee and revealed good preservation of the cartilage space.  No chondrocalcinosis or significant osteophyte formation noted.  No fracture or dislocation noted.  MRI obtained 04/25/2019 was reviewed.  MRI report revealed  MRI OF THE LEFT KNEE WITHOUT  CONTRAST  TECHNIQUE: Multiplanar, multisequence MR imaging of the knee was performed. No intravenous contrast was administered.  COMPARISON: Radiographs dated 04/10/2019  FINDINGS: MENISCI  Medial meniscus: There is intrinsic degeneration of the posterior horn. There is suggestion of an incomplete radial tear of the root of the posterior horn. The meniscus is peripherally extruded.  Lateral meniscus: Normal.  LIGAMENTS  Cruciates: Normal.  Collaterals: LCL is normal. There is fluid both superficial and deep to the MCL with the MCL appears otherwise normal. There is a small amount of fluid in the pes anserine bursa.  CARTILAGE  Patellofemoral: Small focal area of partial-thickness cartilage loss of the lateral facet of the patella.  Medial: Diffuse marked thinning of the articular cartilage of the femoral condyle and tibial plateau.  Lateral: Diffuse slight thinning of the  articular cartilage.  Joint: Small joint effusion.Thickened synovium particularly in the medial aspect of the joint adjacent to the medial femoral condyle. No plical thickening. Normal Hoffa's fat pad.  Popliteal Fossa: 4 x 2.5 x 1 cm complex Baker's cyst. The cyst is leaking and also extends into the proximal calf deep to the fascia of the medial head of the gastrocnemius, best seen on series 8.  Extensor Mechanism: Normal.  Bones: There is a focal small subcortical stress fracture of the medial femoral condyle with a stress reaction in the medial tibial plateau with secondary bone edema. The edema and fluid adjacent to the MCL is felt to be secondary to this medial compartment disease.  Other: None  IMPRESSION: 1. Osteoarthritis of the medial compartment with a small subcortical stress fracture of the medial femoral condyle with secondary bone edema. 2. Small joint effusion with leaking Baker's cyst. 3. Degeneration of the posterior horn of the medial meniscus with an incomplete radial tear of the root of the posterior horn. 4. Mild pes anserine bursitis.  Electronically Signed By: Lorriane Shire M.D. On: 04/25/2019 08:21   Impression:     ICD-10-CM  1. Internal derangement of left knee  M23.92      Plan:   Having failed conservative treatment, the patient has elected to proceed with a left knee arthroscopy.  The patient will undergo a left knee arthroscopy with Dr. Marry Guan.  The risks of surgery, including blood clot and infection, were discussed with the patient.  Measures to reduce these risks, including the use of anticoagulation, perioperative antibiotics, and early ambulation were discussed. The patient elects to proceed with surgery. The patient is instructed to stop all blood thinners prior to surgery.  The patient is instructed to call the hospital the day before surgery to learn of the proper arrival time.    Contact our office with any  questions or concerns.  Follow up as indicated, or sooner should any new problems arise, if conditions worsen, or if they are otherwise concerned.   Gwenlyn Fudge, PA [facility] [address] [place], [place] [address] Phone: [phone]  This note was generated in part with voice recognition software and I apologize for any typographical errors that were not detected and corrected.     Electronically signed by Gwenlyn Fudge, [place] on 06/28/2019 5:56 PM

## 2019-07-04 NOTE — Transfer of Care (Signed)
Immediate Anesthesia Transfer of Care Note  Patient: Christina Bird  Procedure(s) Performed: ARTHROSCOPY KNEE (Left Knee)  Patient Location: PACU  Anesthesia Type:General  Level of Consciousness: awake, alert  and oriented  Airway & Oxygen Therapy: Patient Spontanous Breathing and Patient connected to nasal cannula oxygen  Post-op Assessment: Report given to RN and Post -op Vital signs reviewed and stable  Post vital signs: Reviewed and stable  Last Vitals:  Vitals Value Taken Time  BP 129/72 07/04/19 1351  Temp 36.4 C 07/04/19 1350  Pulse 75 07/04/19 1357  Resp 18 07/04/19 1357  SpO2 97 % 07/04/19 1357  Vitals shown include unvalidated device data.  Last Pain:  Vitals:   07/04/19 1026  TempSrc: Tympanic  PainSc: 5          Complications: No apparent anesthesia complications

## 2019-07-14 DIAGNOSIS — I129 Hypertensive chronic kidney disease with stage 1 through stage 4 chronic kidney disease, or unspecified chronic kidney disease: Secondary | ICD-10-CM | POA: Diagnosis not present

## 2019-07-14 DIAGNOSIS — N183 Chronic kidney disease, stage 3 unspecified: Secondary | ICD-10-CM | POA: Diagnosis not present

## 2019-07-14 DIAGNOSIS — E781 Pure hyperglyceridemia: Secondary | ICD-10-CM | POA: Diagnosis not present

## 2019-07-21 DIAGNOSIS — I129 Hypertensive chronic kidney disease with stage 1 through stage 4 chronic kidney disease, or unspecified chronic kidney disease: Secondary | ICD-10-CM | POA: Diagnosis not present

## 2019-07-21 DIAGNOSIS — E781 Pure hyperglyceridemia: Secondary | ICD-10-CM | POA: Diagnosis not present

## 2019-07-21 DIAGNOSIS — F325 Major depressive disorder, single episode, in full remission: Secondary | ICD-10-CM | POA: Diagnosis not present

## 2019-07-21 DIAGNOSIS — K219 Gastro-esophageal reflux disease without esophagitis: Secondary | ICD-10-CM | POA: Diagnosis not present

## 2019-07-21 DIAGNOSIS — J4541 Moderate persistent asthma with (acute) exacerbation: Secondary | ICD-10-CM | POA: Diagnosis not present

## 2019-07-21 DIAGNOSIS — N183 Chronic kidney disease, stage 3 unspecified: Secondary | ICD-10-CM | POA: Diagnosis not present

## 2019-07-21 DIAGNOSIS — Z0000 Encounter for general adult medical examination without abnormal findings: Secondary | ICD-10-CM | POA: Diagnosis not present

## 2019-11-04 DIAGNOSIS — H353131 Nonexudative age-related macular degeneration, bilateral, early dry stage: Secondary | ICD-10-CM | POA: Diagnosis not present

## 2019-12-01 DIAGNOSIS — R21 Rash and other nonspecific skin eruption: Secondary | ICD-10-CM | POA: Diagnosis not present

## 2019-12-09 DIAGNOSIS — I10 Essential (primary) hypertension: Secondary | ICD-10-CM | POA: Diagnosis not present

## 2019-12-09 DIAGNOSIS — H2511 Age-related nuclear cataract, right eye: Secondary | ICD-10-CM | POA: Diagnosis not present

## 2019-12-13 ENCOUNTER — Encounter: Payer: Self-pay | Admitting: Ophthalmology

## 2019-12-13 ENCOUNTER — Other Ambulatory Visit: Payer: Self-pay

## 2019-12-19 ENCOUNTER — Other Ambulatory Visit: Payer: Self-pay

## 2019-12-19 ENCOUNTER — Other Ambulatory Visit
Admission: RE | Admit: 2019-12-19 | Discharge: 2019-12-19 | Disposition: A | Discharge status: Home / Self Care | Payer: PPO | Source: Ambulatory Visit | Attending: Ophthalmology | Admitting: Ophthalmology

## 2019-12-19 DIAGNOSIS — Z20822 Contact with and (suspected) exposure to covid-19: Secondary | ICD-10-CM | POA: Diagnosis not present

## 2019-12-19 DIAGNOSIS — Z01812 Encounter for preprocedural laboratory examination: Secondary | ICD-10-CM | POA: Diagnosis not present

## 2019-12-20 LAB — SARS CORONAVIRUS 2 (TAT 6-24 HRS): SARS Coronavirus 2: NEGATIVE

## 2019-12-20 NOTE — Discharge Instructions (Signed)

## 2019-12-21 ENCOUNTER — Ambulatory Visit: Payer: PPO | Admitting: Anesthesiology

## 2019-12-21 ENCOUNTER — Encounter: Payer: Self-pay | Admitting: Ophthalmology

## 2019-12-21 ENCOUNTER — Ambulatory Visit
Admission: RE | Admit: 2019-12-21 | Discharge: 2019-12-21 | Disposition: A | Discharge status: Home / Self Care | Payer: PPO | Attending: Ophthalmology | Admitting: Ophthalmology

## 2019-12-21 ENCOUNTER — Encounter
Admission: RE | Disposition: A | Discharge status: Home / Self Care | Payer: Self-pay | Source: Home / Self Care | Attending: Ophthalmology

## 2019-12-21 DIAGNOSIS — H2511 Age-related nuclear cataract, right eye: Secondary | ICD-10-CM | POA: Diagnosis not present

## 2019-12-21 DIAGNOSIS — E7800 Pure hypercholesterolemia, unspecified: Secondary | ICD-10-CM | POA: Insufficient documentation

## 2019-12-21 DIAGNOSIS — D631 Anemia in chronic kidney disease: Secondary | ICD-10-CM | POA: Insufficient documentation

## 2019-12-21 DIAGNOSIS — K219 Gastro-esophageal reflux disease without esophagitis: Secondary | ICD-10-CM | POA: Diagnosis not present

## 2019-12-21 DIAGNOSIS — Z87891 Personal history of nicotine dependence: Secondary | ICD-10-CM | POA: Diagnosis not present

## 2019-12-21 DIAGNOSIS — I129 Hypertensive chronic kidney disease with stage 1 through stage 4 chronic kidney disease, or unspecified chronic kidney disease: Secondary | ICD-10-CM | POA: Diagnosis not present

## 2019-12-21 DIAGNOSIS — E785 Hyperlipidemia, unspecified: Secondary | ICD-10-CM | POA: Diagnosis not present

## 2019-12-21 DIAGNOSIS — F329 Major depressive disorder, single episode, unspecified: Secondary | ICD-10-CM | POA: Insufficient documentation

## 2019-12-21 DIAGNOSIS — N183 Chronic kidney disease, stage 3 unspecified: Secondary | ICD-10-CM | POA: Diagnosis not present

## 2019-12-21 DIAGNOSIS — Z903 Acquired absence of stomach [part of]: Secondary | ICD-10-CM | POA: Insufficient documentation

## 2019-12-21 DIAGNOSIS — J45909 Unspecified asthma, uncomplicated: Secondary | ICD-10-CM | POA: Diagnosis not present

## 2019-12-21 DIAGNOSIS — M1990 Unspecified osteoarthritis, unspecified site: Secondary | ICD-10-CM | POA: Diagnosis not present

## 2019-12-21 DIAGNOSIS — R519 Headache, unspecified: Secondary | ICD-10-CM | POA: Diagnosis not present

## 2019-12-21 DIAGNOSIS — H25811 Combined forms of age-related cataract, right eye: Secondary | ICD-10-CM | POA: Diagnosis not present

## 2019-12-21 DIAGNOSIS — Z90710 Acquired absence of both cervix and uterus: Secondary | ICD-10-CM | POA: Insufficient documentation

## 2019-12-21 HISTORY — PX: CATARACT EXTRACTION W/PHACO: SHX586

## 2019-12-21 SURGERY — PHACOEMULSIFICATION, CATARACT, WITH IOL INSERTION
Anesthesia: Monitor Anesthesia Care | Site: Eye | Laterality: Right

## 2019-12-21 MED ORDER — ACETAMINOPHEN 325 MG PO TABS: 325.0000 mg | ORAL_TABLET | ORAL | Status: DC | PRN

## 2019-12-21 MED ORDER — ACETAMINOPHEN 160 MG/5ML PO SOLN: 325.0000 mg | ORAL | Status: DC | PRN

## 2019-12-21 MED ORDER — FENTANYL CITRATE (PF) 100 MCG/2ML IJ SOLN
INTRAMUSCULAR | Status: DC | PRN
  Administered 2019-12-21: 50 ug via INTRAVENOUS

## 2019-12-21 MED ORDER — CEFUROXIME OPHTHALMIC INJECTION 1 MG/0.1 ML
INJECTION | OPHTHALMIC | Status: DC | PRN
  Administered 2019-12-21: 0.1 mL via INTRACAMERAL

## 2019-12-21 MED ORDER — TETRACAINE HCL 0.5 % OP SOLN
1.0000 [drp] | OPHTHALMIC | Status: DC | PRN
  Administered 2019-12-21 (×3): 1 [drp] via OPHTHALMIC

## 2019-12-21 MED ORDER — MIDAZOLAM HCL 2 MG/2ML IJ SOLN
INTRAMUSCULAR | Status: DC | PRN
  Administered 2019-12-21: 1 mg via INTRAVENOUS

## 2019-12-21 MED ORDER — ARMC OPHTHALMIC DILATING DROPS
1.0000 "application " | OPHTHALMIC | Status: DC | PRN
  Administered 2019-12-21 (×3): 1 via OPHTHALMIC

## 2019-12-21 MED ORDER — LIDOCAINE HCL (PF) 2 % IJ SOLN
INTRAOCULAR | Status: DC | PRN
  Administered 2019-12-21: 1 mL

## 2019-12-21 MED ORDER — BRIMONIDINE TARTRATE-TIMOLOL 0.2-0.5 % OP SOLN
OPHTHALMIC | Status: DC | PRN
  Administered 2019-12-21: 1 [drp] via OPHTHALMIC

## 2019-12-21 MED ORDER — NA HYALUR & NA CHOND-NA HYALUR 0.4-0.35 ML IO KIT
PACK | INTRAOCULAR | Status: DC | PRN
  Administered 2019-12-21: 1 mL via INTRAOCULAR

## 2019-12-21 MED ORDER — MOXIFLOXACIN HCL 0.5 % OP SOLN
1.0000 [drp] | OPHTHALMIC | Status: DC | PRN
  Administered 2019-12-21 (×3): 1 [drp] via OPHTHALMIC

## 2019-12-21 MED ORDER — EPINEPHRINE PF 1 MG/ML IJ SOLN
INTRAOCULAR | Status: DC | PRN
  Administered 2019-12-21: 63 mL via OPHTHALMIC

## 2019-12-21 MED ORDER — ONDANSETRON HCL 4 MG/2ML IJ SOLN: 4.0000 mg | Freq: Once | INTRAMUSCULAR | Status: DC | PRN

## 2019-12-21 SURGICAL SUPPLY — 23 items
CANNULA ANT/CHMB 27G (MISCELLANEOUS) ×1 IMPLANT
CANNULA ANT/CHMB 27GA (MISCELLANEOUS) ×3 IMPLANT
GLOVE SURG LX 7.5 STRW (GLOVE) ×2
GLOVE SURG LX STRL 7.5 STRW (GLOVE) ×1 IMPLANT
GLOVE SURG TRIUMPH 8.0 PF LTX (GLOVE) ×3 IMPLANT
GOWN STRL REUS W/ TWL LRG LVL3 (GOWN DISPOSABLE) ×2 IMPLANT
GOWN STRL REUS W/TWL LRG LVL3 (GOWN DISPOSABLE) ×4
LENS IOL DIOP 18.5 (Intraocular Lens) ×3 IMPLANT
LENS IOL TECNIS MONO 18.5 (Intraocular Lens) IMPLANT
MARKER SKIN DUAL TIP RULER LAB (MISCELLANEOUS) ×3 IMPLANT
NDL CAPSULORHEX 25GA (NEEDLE) ×1 IMPLANT
NDL FILTER BLUNT 18X1 1/2 (NEEDLE) ×2 IMPLANT
NEEDLE CAPSULORHEX 25GA (NEEDLE) ×3 IMPLANT
NEEDLE FILTER BLUNT 18X 1/2SAF (NEEDLE) ×4
NEEDLE FILTER BLUNT 18X1 1/2 (NEEDLE) ×2 IMPLANT
PACK CATARACT BRASINGTON (MISCELLANEOUS) ×3 IMPLANT
PACK EYE AFTER SURG (MISCELLANEOUS) ×3 IMPLANT
PACK OPTHALMIC (MISCELLANEOUS) ×3 IMPLANT
SOLUTION OPHTHALMIC SALT (MISCELLANEOUS) ×3 IMPLANT
SYR 3ML LL SCALE MARK (SYRINGE) ×6 IMPLANT
SYR TB 1ML LUER SLIP (SYRINGE) ×3 IMPLANT
WATER STERILE IRR 250ML POUR (IV SOLUTION) ×3 IMPLANT
WIPE NON LINTING 3.25X3.25 (MISCELLANEOUS) ×3 IMPLANT

## 2019-12-21 NOTE — Anesthesia Postprocedure Evaluation (Signed)
Anesthesia Post Note  Patient: Christina Bird  Procedure(s) Performed: CATARACT EXTRACTION PHACO AND INTRAOCULAR LENS PLACEMENT (IOC) RIGHT 5.55  00:55.3  10.0% (Right Eye)     Patient location during evaluation: PACU Anesthesia Type: MAC Level of consciousness: awake Pain management: pain level controlled Vital Signs Assessment: post-procedure vital signs reviewed and stable Respiratory status: respiratory function stable Cardiovascular status: stable Postop Assessment: no apparent nausea or vomiting Anesthetic complications: no   No complications documented.  Veda Canning

## 2019-12-21 NOTE — Transfer of Care (Signed)
Immediate Anesthesia Transfer of Care Note  Patient: Christina Bird  Procedure(s) Performed: CATARACT EXTRACTION PHACO AND INTRAOCULAR LENS PLACEMENT (IOC) RIGHT 5.55  00:55.3  10.0% (Right Eye)  Patient Location: PACU  Anesthesia Type: MAC  Level of Consciousness: awake, alert  and patient cooperative  Airway and Oxygen Therapy: Patient Spontanous Breathing and Patient connected to supplemental oxygen  Post-op Assessment: Post-op Vital signs reviewed, Patient's Cardiovascular Status Stable, Respiratory Function Stable, Patent Airway and No signs of Nausea or vomiting  Post-op Vital Signs: Reviewed and stable  Complications: No complications documented.

## 2019-12-21 NOTE — H&P (Signed)

## 2019-12-21 NOTE — Anesthesia Procedure Notes (Signed)
Procedure Name: MAC Date/Time: 12/21/2019 11:05 AM Performed by: Cameron Ali, CRNA Pre-anesthesia Checklist: Patient identified, Emergency Drugs available, Suction available, Timeout performed and Patient being monitored Patient Re-evaluated:Patient Re-evaluated prior to induction Oxygen Delivery Method: Nasal cannula Placement Confirmation: positive ETCO2

## 2019-12-21 NOTE — Anesthesia Preprocedure Evaluation (Signed)
Anesthesia Evaluation  Patient identified by MRN, date of birth, ID band Patient awake    Reviewed: Allergy & Precautions, NPO status   Airway Mallampati: II  TM Distance: >3 FB     Dental   Pulmonary asthma , former smoker,    breath sounds clear to auscultation       Cardiovascular hypertension,  Rhythm:Regular Rate:Normal  HLD   Neuro/Psych  Headaches, PSYCHIATRIC DISORDERS Depression    GI/Hepatic GERD  ,  Endo/Other    Renal/GU Renal disease (stage 3 CKD)     Musculoskeletal  (+) Arthritis ,   Abdominal   Peds  Hematology  (+) anemia ,   Anesthesia Other Findings   Reproductive/Obstetrics                             Anesthesia Physical Anesthesia Plan  ASA: III  Anesthesia Plan: MAC   Post-op Pain Management:    Induction: Intravenous  PONV Risk Score and Plan: 2 and TIVA, Midazolam and Treatment may vary due to age or medical condition  Airway Management Planned: Natural Airway and Nasal Cannula  Additional Equipment:   Intra-op Plan:   Post-operative Plan:   Informed Consent: I have reviewed the patients History and Physical, chart, labs and discussed the procedure including the risks, benefits and alternatives for the proposed anesthesia with the patient or authorized representative who has indicated his/her understanding and acceptance.       Plan Discussed with: CRNA  Anesthesia Plan Comments:         Anesthesia Quick Evaluation

## 2019-12-21 NOTE — Op Note (Signed)
LOCATION:  [place]   PREOPERATIVE DIAGNOSIS:    Nuclear sclerotic cataract right eye. H25.11   POSTOPERATIVE DIAGNOSIS:  Nuclear sclerotic cataract right eye.     PROCEDURE:  Phacoemusification with posterior chamber intraocular lens placement of the right eye   ULTRASOUND TIME: Procedure(s) with comments: CATARACT EXTRACTION PHACO AND INTRAOCULAR LENS PLACEMENT (IOC) RIGHT 5.55  00:55.3  10.0% (Right) - requests arrival around 10  LENS:   Implant Name Type Inv. Item Serial No. Manufacturer Lot No. LRB No. Used Action  LENS IOL DIOP 18.5 - [id number] Intraocular Lens LENS IOL DIOP 18.5 [id number] [employer]  Right 1 Implanted         SURGEON:  Wyonia Hough, MD   ANESTHESIA:  Topical with tetracaine drops and 2% Xylocaine jelly, augmented with 1% preservative-free intracameral lidocaine.    COMPLICATIONS:  None.   DESCRIPTION OF PROCEDURE:  The patient was identified in the holding room and transported to the operating room and placed in the supine position under the operating microscope.  The right eye was identified as the operative eye and it was prepped and draped in the usual sterile ophthalmic fashion.   A 1 millimeter clear-corneal paracentesis was made at the 12:00 position.  0.5 ml of preservative-free 1% lidocaine was injected into the anterior chamber. The anterior chamber was filled with Viscoat viscoelastic.  A 2.4 millimeter keratome was used to make a near-clear corneal incision at the 9:00 position.  A curvilinear capsulorrhexis was made with a cystotome and capsulorrhexis forceps.  Balanced salt solution was used to hydrodissect and hydrodelineate the nucleus.   Phacoemulsification was then used in stop and chop fashion to remove the lens nucleus and epinucleus.  The remaining cortex was then removed using the irrigation and aspiration handpiece. Provisc was then placed into the capsular bag to distend it for lens placement.  A lens  was then injected into the capsular bag.  The remaining viscoelastic was aspirated.   Wounds were hydrated with balanced salt solution.  The anterior chamber was inflated to a physiologic pressure with balanced salt solution.  No wound leaks were noted. .cbef   Timolol and Brimonidine drops were applied to the eye.  The patient was taken to the recovery room in stable condition without complications of anesthesia or surgery.   Ayan Yankey 12/21/2019, 11:23 AM

## 2019-12-22 ENCOUNTER — Encounter: Payer: Self-pay | Admitting: Ophthalmology

## 2019-12-22 DIAGNOSIS — J45909 Unspecified asthma, uncomplicated: Secondary | ICD-10-CM | POA: Diagnosis not present

## 2019-12-22 DIAGNOSIS — H2512 Age-related nuclear cataract, left eye: Secondary | ICD-10-CM | POA: Diagnosis not present

## 2020-01-03 ENCOUNTER — Encounter: Payer: Self-pay | Admitting: Ophthalmology

## 2020-01-09 NOTE — Discharge Instructions (Signed)

## 2020-01-11 ENCOUNTER — Encounter: Payer: Self-pay | Admitting: Ophthalmology

## 2020-01-11 ENCOUNTER — Ambulatory Visit
Admission: RE | Admit: 2020-01-11 | Discharge: 2020-01-11 | Disposition: A | Discharge status: Home / Self Care | Payer: PPO | Attending: Ophthalmology | Admitting: Ophthalmology

## 2020-01-11 ENCOUNTER — Other Ambulatory Visit: Payer: Self-pay

## 2020-01-11 ENCOUNTER — Ambulatory Visit: Payer: PPO | Admitting: Anesthesiology

## 2020-01-11 ENCOUNTER — Encounter
Admission: RE | Disposition: A | Discharge status: Home / Self Care | Payer: Self-pay | Source: Home / Self Care | Attending: Ophthalmology

## 2020-01-11 DIAGNOSIS — J45909 Unspecified asthma, uncomplicated: Secondary | ICD-10-CM | POA: Insufficient documentation

## 2020-01-11 DIAGNOSIS — H25812 Combined forms of age-related cataract, left eye: Secondary | ICD-10-CM | POA: Diagnosis not present

## 2020-01-11 DIAGNOSIS — F329 Major depressive disorder, single episode, unspecified: Secondary | ICD-10-CM | POA: Diagnosis not present

## 2020-01-11 DIAGNOSIS — H2512 Age-related nuclear cataract, left eye: Secondary | ICD-10-CM | POA: Diagnosis not present

## 2020-01-11 DIAGNOSIS — E7800 Pure hypercholesterolemia, unspecified: Secondary | ICD-10-CM | POA: Diagnosis not present

## 2020-01-11 DIAGNOSIS — Z79899 Other long term (current) drug therapy: Secondary | ICD-10-CM | POA: Insufficient documentation

## 2020-01-11 DIAGNOSIS — Z881 Allergy status to other antibiotic agents status: Secondary | ICD-10-CM | POA: Diagnosis not present

## 2020-01-11 DIAGNOSIS — I129 Hypertensive chronic kidney disease with stage 1 through stage 4 chronic kidney disease, or unspecified chronic kidney disease: Secondary | ICD-10-CM | POA: Diagnosis not present

## 2020-01-11 DIAGNOSIS — N183 Chronic kidney disease, stage 3 unspecified: Secondary | ICD-10-CM | POA: Insufficient documentation

## 2020-01-11 DIAGNOSIS — Z87891 Personal history of nicotine dependence: Secondary | ICD-10-CM | POA: Diagnosis not present

## 2020-01-11 DIAGNOSIS — K219 Gastro-esophageal reflux disease without esophagitis: Secondary | ICD-10-CM | POA: Diagnosis not present

## 2020-01-11 DIAGNOSIS — Z7951 Long term (current) use of inhaled steroids: Secondary | ICD-10-CM | POA: Diagnosis not present

## 2020-01-11 DIAGNOSIS — Z85828 Personal history of other malignant neoplasm of skin: Secondary | ICD-10-CM | POA: Diagnosis not present

## 2020-01-11 HISTORY — PX: CATARACT EXTRACTION W/PHACO: SHX586

## 2020-01-11 SURGERY — PHACOEMULSIFICATION, CATARACT, WITH IOL INSERTION
Anesthesia: Monitor Anesthesia Care | Site: Eye | Laterality: Left

## 2020-01-11 MED ORDER — MIDAZOLAM HCL 2 MG/2ML IJ SOLN
INTRAMUSCULAR | Status: DC | PRN
  Administered 2020-01-11: 1 mg via INTRAVENOUS

## 2020-01-11 MED ORDER — TETRACAINE HCL 0.5 % OP SOLN
1.0000 [drp] | OPHTHALMIC | Status: DC | PRN
  Administered 2020-01-11 (×3): 1 [drp] via OPHTHALMIC

## 2020-01-11 MED ORDER — FENTANYL CITRATE (PF) 100 MCG/2ML IJ SOLN
INTRAMUSCULAR | Status: DC | PRN
  Administered 2020-01-11: 50 ug via INTRAVENOUS

## 2020-01-11 MED ORDER — ACETAMINOPHEN 325 MG PO TABS: 325.0000 mg | ORAL_TABLET | ORAL | Status: DC | PRN

## 2020-01-11 MED ORDER — LIDOCAINE HCL (PF) 2 % IJ SOLN
INTRAOCULAR | Status: DC | PRN
  Administered 2020-01-11: 1 mL

## 2020-01-11 MED ORDER — EPINEPHRINE PF 1 MG/ML IJ SOLN
INTRAOCULAR | Status: DC | PRN
  Administered 2020-01-11: 77 mL via OPHTHALMIC

## 2020-01-11 MED ORDER — NEOMYCIN-POLYMYXIN-DEXAMETH 3.5-10000-0.1 OP OINT
TOPICAL_OINTMENT | OPHTHALMIC | Status: DC | PRN
Start: 2020-01-11 — End: 2020-01-11
  Administered 2020-01-11: 1 via OPHTHALMIC

## 2020-01-11 MED ORDER — ACETAMINOPHEN 160 MG/5ML PO SOLN: 325.0000 mg | ORAL | Status: DC | PRN

## 2020-01-11 MED ORDER — BRIMONIDINE TARTRATE-TIMOLOL 0.2-0.5 % OP SOLN
OPHTHALMIC | Status: DC | PRN
  Administered 2020-01-11: 1 [drp] via OPHTHALMIC

## 2020-01-11 MED ORDER — CEFUROXIME OPHTHALMIC INJECTION 1 MG/0.1 ML
INJECTION | OPHTHALMIC | Status: DC | PRN
  Administered 2020-01-11: 0.1 mL via INTRACAMERAL

## 2020-01-11 MED ORDER — LACTATED RINGERS IV SOLN: INTRAVENOUS | Status: DC

## 2020-01-11 MED ORDER — NA HYALUR & NA CHOND-NA HYALUR 0.4-0.35 ML IO KIT
PACK | INTRAOCULAR | Status: DC | PRN
  Administered 2020-01-11: 1 mL via INTRAOCULAR

## 2020-01-11 MED ORDER — ARMC OPHTHALMIC DILATING DROPS
1.0000 "application " | OPHTHALMIC | Status: DC | PRN
  Administered 2020-01-11 (×3): 1 via OPHTHALMIC

## 2020-01-11 MED ORDER — MOXIFLOXACIN HCL 0.5 % OP SOLN
1.0000 [drp] | OPHTHALMIC | Status: DC | PRN
  Administered 2020-01-11 (×3): 1 [drp] via OPHTHALMIC

## 2020-01-11 SURGICAL SUPPLY — 29 items
CANNULA ANT/CHMB 27G (MISCELLANEOUS) ×1 IMPLANT
CANNULA ANT/CHMB 27GA (MISCELLANEOUS) ×3 IMPLANT
GLOVE SURG LX 7.5 STRW (GLOVE) ×4
GLOVE SURG LX STRL 7.5 STRW (GLOVE) ×1 IMPLANT
GLOVE SURG TRIUMPH 8.0 PF LTX (GLOVE) ×3 IMPLANT
GOWN STRL REUS W/ TWL LRG LVL3 (GOWN DISPOSABLE) ×2 IMPLANT
GOWN STRL REUS W/TWL LRG LVL3 (GOWN DISPOSABLE) ×4
LENS IOL DIOP 16.5 (Intraocular Lens) ×3 IMPLANT
LENS IOL TECNIS MONO 16.5 (Intraocular Lens) IMPLANT
MARKER SKIN DUAL TIP RULER LAB (MISCELLANEOUS) ×3 IMPLANT
NDL CAPSULORHEX 25GA (NEEDLE) ×1 IMPLANT
NDL FILTER BLUNT 18X1 1/2 (NEEDLE) ×2 IMPLANT
NDL RETROBULBAR .5 NSTRL (NEEDLE) IMPLANT
NEEDLE CAPSULORHEX 25GA (NEEDLE) ×3 IMPLANT
NEEDLE FILTER BLUNT 18X 1/2SAF (NEEDLE) ×4
NEEDLE FILTER BLUNT 18X1 1/2 (NEEDLE) ×2 IMPLANT
PACK CATARACT BRASINGTON (MISCELLANEOUS) ×3 IMPLANT
PACK EYE AFTER SURG (MISCELLANEOUS) ×3 IMPLANT
PACK OPTHALMIC (MISCELLANEOUS) ×3 IMPLANT
RING MALYGIN 7.0 (MISCELLANEOUS) IMPLANT
SOLUTION OPHTHALMIC SALT (MISCELLANEOUS) ×3 IMPLANT
SUT ETHILON 10-0 CS-B-6CS-B-6 (SUTURE)
SUT VICRYL  9 0 (SUTURE)
SUT VICRYL 9 0 (SUTURE) IMPLANT
SUTURE EHLN 10-0 CS-B-6CS-B-6 (SUTURE) IMPLANT
SYR 3ML LL SCALE MARK (SYRINGE) ×6 IMPLANT
SYR TB 1ML LUER SLIP (SYRINGE) ×3 IMPLANT
WATER STERILE IRR 250ML POUR (IV SOLUTION) ×3 IMPLANT
WIPE NON LINTING 3.25X3.25 (MISCELLANEOUS) ×3 IMPLANT

## 2020-01-11 NOTE — Anesthesia Procedure Notes (Signed)
Procedure Name: MAC Date/Time: 01/11/2020 11:50 AM Performed by: Cameron Ali, CRNA Pre-anesthesia Checklist: Patient identified, Emergency Drugs available, Suction available, Timeout performed and Patient being monitored Patient Re-evaluated:Patient Re-evaluated prior to induction Oxygen Delivery Method: Nasal cannula Placement Confirmation: positive ETCO2

## 2020-01-11 NOTE — Op Note (Signed)
OPERATIVE NOTE  Christina Bird [id number] 01/11/2020   PREOPERATIVE DIAGNOSIS:  Nuclear sclerotic cataract left eye. H25.12   POSTOPERATIVE DIAGNOSIS:    Nuclear sclerotic cataract left eye.     PROCEDURE:  Phacoemusification with posterior chamber intraocular lens placement of the left eye  Ultrasound time: Procedure(s) with comments: CATARACT EXTRACTION PHACO AND INTRAOCULAR LENS PLACEMENT (IOC) LEFT (Left) - 7.18 0:57.0 12.6%  LENS:   Implant Name Type Inv. Item Serial No. Manufacturer Lot No. LRB No. Used Action  LENS IOL DIOP 16.5 - [id number] Intraocular Lens LENS IOL DIOP 16.5 [id number] [employer]  Left 1 Implanted      SURGEON:  Wyonia Hough, MD   ANESTHESIA:  Topical with tetracaine drops and 2% Xylocaine jelly, augmented with 1% preservative-free intracameral lidocaine.    COMPLICATIONS:  None.   DESCRIPTION OF PROCEDURE:  The patient was identified in the holding room and transported to the operating room and placed in the supine position under the operating microscope.  The left eye was identified as the operative eye and it was prepped and draped in the usual sterile ophthalmic fashion.   A 1 millimeter clear-corneal paracentesis was made at the 1:30 position.  0.5 ml of preservative-free 1% lidocaine was injected into the anterior chamber.  The anterior chamber was filled with Viscoat viscoelastic.  A 2.4 millimeter keratome was used to make a near-clear corneal incision at the 10:30 position.  .  A curvilinear capsulorrhexis was made with a cystotome and capsulorrhexis forceps.  Balanced salt solution was used to hydrodissect and hydrodelineate the nucleus.   Phacoemulsification was then used in stop and chop fashion to remove the lens nucleus and epinucleus.  The remaining cortex was then removed using the irrigation and aspiration handpiece. Provisc was then placed into the capsular bag to distend it for lens placement.  A lens was then  injected into the capsular bag.  The remaining viscoelastic was aspirated.   Wounds were hydrated with balanced salt solution.  The anterior chamber was inflated to a physiologic pressure with balanced salt solution.  No wound leaks were noted. Cefuroxime 0.1 ml of a 10mg /ml solution was injected into the anterior chamber for a dose of 1 mg of intracameral antibiotic at the completion of the case.   Timolol and Brimonidine drops were applied to the eye.  The patient was taken to the recovery room in stable condition without complications of anesthesia or surgery.  [name] 01/11/2020, 12:11 PM

## 2020-01-11 NOTE — H&P (Signed)
.  cbh The History and Physical notes are on paper, have been signed, and are to be scanned. The patient remains stable and unchanged from the H&P.   Previous H&P reviewed, patient examined, and there are no changes.  The patient has a visually significant cataract interfering with his or her vision.  I attest that the following are true and accurate to the best of my knowledge: 1. The patient's impairment of visual function is believed not to be correctable with a tolerable change in glasses or contact lenses. 2. Cataract (in the operative eye) is believed to be significantly contributing to the patient's visual impairment. 3. The patient desires surgical correction; the risks, benefits, and alternatives have been explained, and questions have been answered to the patients satisfaction.  A reasonable expectation exists that cataract surgery will significantly improve both the visual and functional status of the patient.  Christina Bird 01/11/2020 10:56 AM

## 2020-01-11 NOTE — Anesthesia Preprocedure Evaluation (Signed)
Anesthesia Evaluation  Patient identified by MRN, date of birth, ID band Patient awake    Reviewed: Allergy & Precautions, H&P , NPO status , Patient's Chart, lab work & pertinent test results, reviewed documented beta blocker date and time   Airway Mallampati: II  TM Distance: >3 FB Neck ROM: full    Dental no notable dental hx.    Pulmonary asthma , former smoker,    Pulmonary exam normal breath sounds clear to auscultation       Cardiovascular Exercise Tolerance: Good hypertension, Normal cardiovascular exam Rhythm:regular Rate:Normal     Neuro/Psych negative neurological ROS  negative psych ROS   GI/Hepatic Neg liver ROS, GERD  ,  Endo/Other  negative endocrine ROS  Renal/GU CRFRenal disease  negative genitourinary   Musculoskeletal   Abdominal   Peds  Hematology negative hematology ROS (+)   Anesthesia Other Findings   Reproductive/Obstetrics negative OB ROS                             Anesthesia Physical Anesthesia Plan  ASA: II  Anesthesia Plan: MAC   Post-op Pain Management:    Induction:   PONV Risk Score and Plan:   Airway Management Planned:   Additional Equipment:   Intra-op Plan:   Post-operative Plan:   Informed Consent: I have reviewed the patients History and Physical, chart, labs and discussed the procedure including the risks, benefits and alternatives for the proposed anesthesia with the patient or authorized representative who has indicated his/her understanding and acceptance.     Dental Advisory Given  Plan Discussed with: CRNA and Anesthesiologist  Anesthesia Plan Comments:         Anesthesia Quick Evaluation

## 2020-01-11 NOTE — Anesthesia Postprocedure Evaluation (Signed)
Anesthesia Post Note  Patient: Christina Bird  Procedure(s) Performed: CATARACT EXTRACTION PHACO AND INTRAOCULAR LENS PLACEMENT (IOC) LEFT (Left Eye)     Patient location during evaluation: PACU Anesthesia Type: MAC Level of consciousness: awake and alert Pain management: pain level controlled Vital Signs Assessment: post-procedure vital signs reviewed and stable Respiratory status: spontaneous breathing, nonlabored ventilation, respiratory function stable and patient connected to nasal cannula oxygen Cardiovascular status: stable and blood pressure returned to baseline Postop Assessment: no apparent nausea or vomiting Anesthetic complications: no   No complications documented.  Trecia Rogers

## 2020-01-11 NOTE — Transfer of Care (Signed)
Immediate Anesthesia Transfer of Care Note  Patient: Christina Bird  Procedure(s) Performed: CATARACT EXTRACTION PHACO AND INTRAOCULAR LENS PLACEMENT (IOC) LEFT (Left Eye)  Patient Location: PACU  Anesthesia Type: MAC  Level of Consciousness: awake, alert  and patient cooperative  Airway and Oxygen Therapy: Patient Spontanous Breathing and Patient connected to supplemental oxygen  Post-op Assessment: Post-op Vital signs reviewed, Patient's Cardiovascular Status Stable, Respiratory Function Stable, Patent Airway and No signs of Nausea or vomiting  Post-op Vital Signs: Reviewed and stable  Complications: No complications documented.

## 2020-01-12 ENCOUNTER — Encounter: Payer: Self-pay | Admitting: Ophthalmology

## 2020-01-13 DIAGNOSIS — E781 Pure hyperglyceridemia: Secondary | ICD-10-CM | POA: Diagnosis not present

## 2020-01-13 DIAGNOSIS — I129 Hypertensive chronic kidney disease with stage 1 through stage 4 chronic kidney disease, or unspecified chronic kidney disease: Secondary | ICD-10-CM | POA: Diagnosis not present

## 2020-01-13 DIAGNOSIS — N183 Chronic kidney disease, stage 3 unspecified: Secondary | ICD-10-CM | POA: Diagnosis not present

## 2020-01-19 DIAGNOSIS — Z0000 Encounter for general adult medical examination without abnormal findings: Secondary | ICD-10-CM | POA: Diagnosis not present

## 2020-01-19 DIAGNOSIS — J4541 Moderate persistent asthma with (acute) exacerbation: Secondary | ICD-10-CM | POA: Diagnosis not present

## 2020-01-19 DIAGNOSIS — E781 Pure hyperglyceridemia: Secondary | ICD-10-CM | POA: Diagnosis not present

## 2020-01-19 DIAGNOSIS — N183 Chronic kidney disease, stage 3 unspecified: Secondary | ICD-10-CM | POA: Diagnosis not present

## 2020-01-19 DIAGNOSIS — I129 Hypertensive chronic kidney disease with stage 1 through stage 4 chronic kidney disease, or unspecified chronic kidney disease: Secondary | ICD-10-CM | POA: Diagnosis not present

## 2020-01-19 DIAGNOSIS — F325 Major depressive disorder, single episode, in full remission: Secondary | ICD-10-CM | POA: Diagnosis not present

## 2020-01-26 DIAGNOSIS — H353132 Nonexudative age-related macular degeneration, bilateral, intermediate dry stage: Secondary | ICD-10-CM | POA: Diagnosis not present

## 2020-02-09 DIAGNOSIS — L905 Scar conditions and fibrosis of skin: Secondary | ICD-10-CM | POA: Diagnosis not present

## 2020-02-09 DIAGNOSIS — D0439 Carcinoma in situ of skin of other parts of face: Secondary | ICD-10-CM | POA: Diagnosis not present

## 2020-02-09 DIAGNOSIS — L500 Allergic urticaria: Secondary | ICD-10-CM | POA: Diagnosis not present

## 2020-02-09 DIAGNOSIS — X32XXXA Exposure to sunlight, initial encounter: Secondary | ICD-10-CM | POA: Diagnosis not present

## 2020-02-09 DIAGNOSIS — D0461 Carcinoma in situ of skin of right upper limb, including shoulder: Secondary | ICD-10-CM | POA: Diagnosis not present

## 2020-02-09 DIAGNOSIS — L309 Dermatitis, unspecified: Secondary | ICD-10-CM | POA: Diagnosis not present

## 2020-02-09 DIAGNOSIS — Z85828 Personal history of other malignant neoplasm of skin: Secondary | ICD-10-CM | POA: Diagnosis not present

## 2020-02-09 DIAGNOSIS — L570 Actinic keratosis: Secondary | ICD-10-CM | POA: Diagnosis not present

## 2020-02-09 DIAGNOSIS — D485 Neoplasm of uncertain behavior of skin: Secondary | ICD-10-CM | POA: Diagnosis not present

## 2020-04-05 DIAGNOSIS — D0439 Carcinoma in situ of skin of other parts of face: Secondary | ICD-10-CM | POA: Diagnosis not present

## 2020-04-05 DIAGNOSIS — C44329 Squamous cell carcinoma of skin of other parts of face: Secondary | ICD-10-CM | POA: Diagnosis not present

## 2020-04-12 DIAGNOSIS — D0461 Carcinoma in situ of skin of right upper limb, including shoulder: Secondary | ICD-10-CM | POA: Diagnosis not present

## 2020-04-12 DIAGNOSIS — L570 Actinic keratosis: Secondary | ICD-10-CM | POA: Diagnosis not present

## 2020-05-31 DIAGNOSIS — H353132 Nonexudative age-related macular degeneration, bilateral, intermediate dry stage: Secondary | ICD-10-CM | POA: Diagnosis not present

## 2020-07-13 IMAGING — MR MR KNEE*L* W/O CM
6 series · 40 of 40 positions shown · non-contrast
Comparison: Radiographs dated 04/10/2019

CLINICAL DATA: Left knee pain. No injury.

EXAM:
MRI OF THE LEFT KNEE WITHOUT CONTRAST
TECHNIQUE: Multiplanar, multisequence MR imaging of the knee was performed. No
intravenous contrast was administered.

[Series 8: T2 fat-sat · axial · left · 4.0mm · 0.50mm/px · z∈[-67,+56]mm · 7 of 26 slices shown (1 of 3)]
[im 1/26]
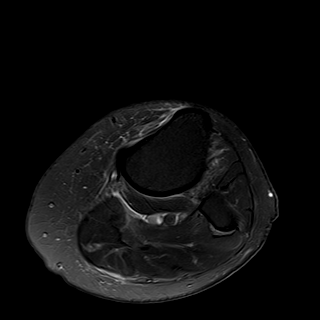
[im 5/26]
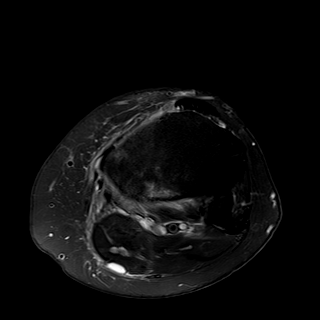
[im 9/26]
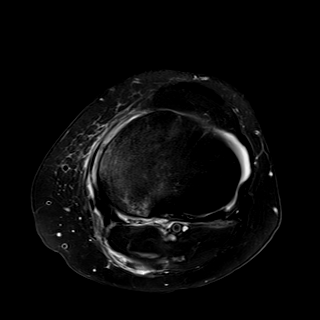
[im 13/26]
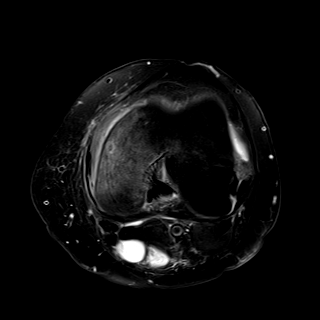
[im 17/26]
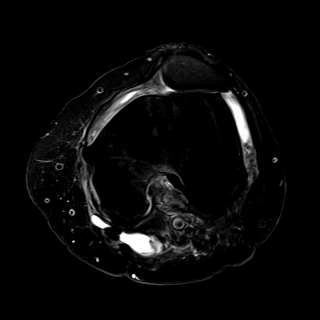
[im 21/26]
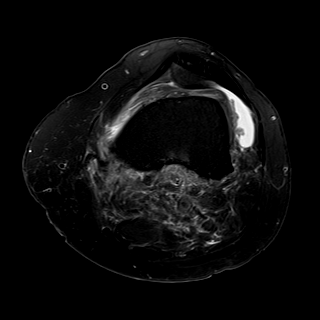
[im 26/26]
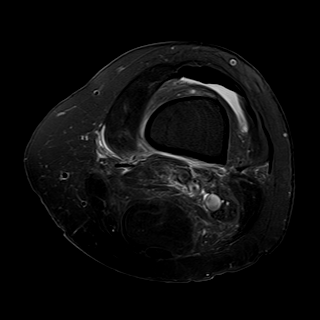

[Series 9: T2 fat-sat · coronal · left · 4.0mm · 0.59mm/px · 6 of 28 slices shown (2 of 3)]
[im 1/28]
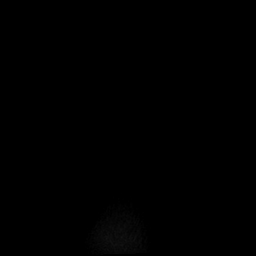
[im 6/28]
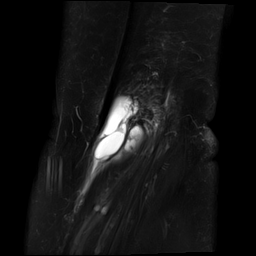
[im 11/28]
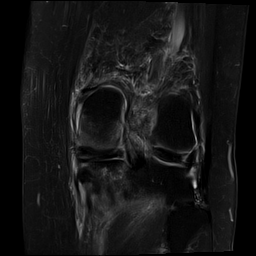
[im 17/28]
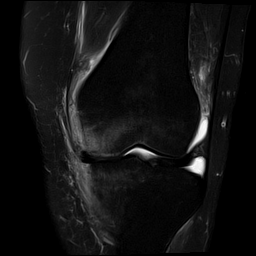
[im 22/28]
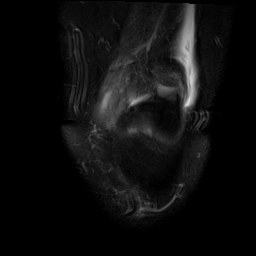
[im 28/28]
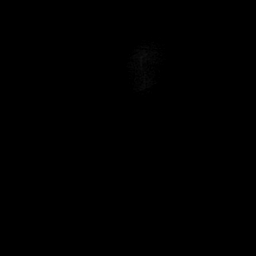

[Series 10: PD fat-sat · sagittal · left · 3.0mm · 0.59mm/px · 7 of 32 slices shown (1 of 2)]
[im 1/32]
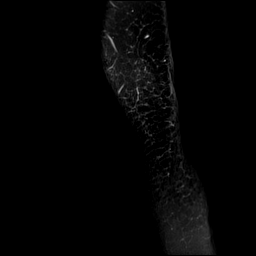
[im 6/32]
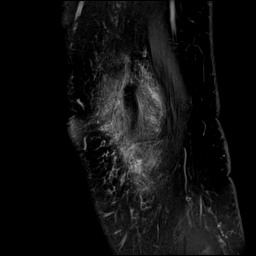
[im 11/32]
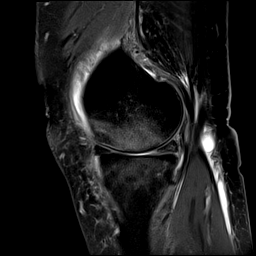
[im 16/32]
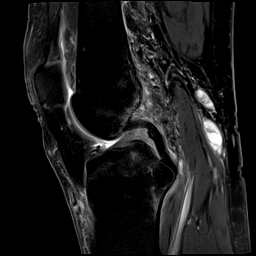
[im 21/32]
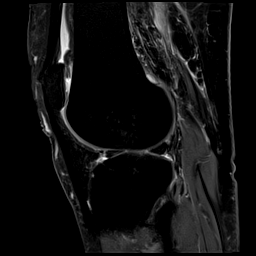
[im 26/32]
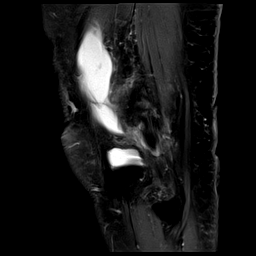
[im 32/32]
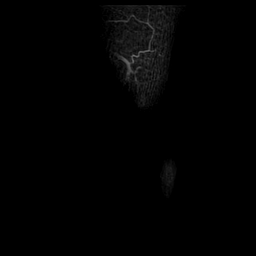

[Series 11: T1 · coronal · left · 4.0mm · 0.59mm/px · 6 of 28 slices shown]
[im 1/28]
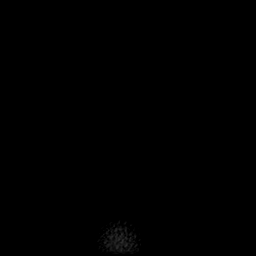
[im 6/28]
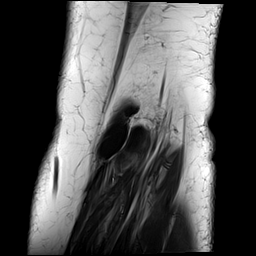
[im 11/28]
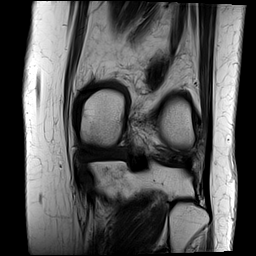
[im 17/28]
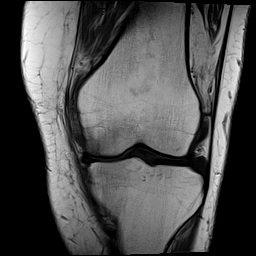
[im 22/28]
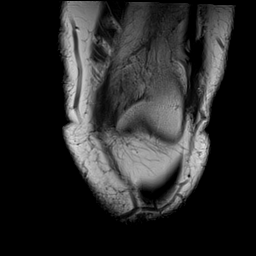
[im 28/28]
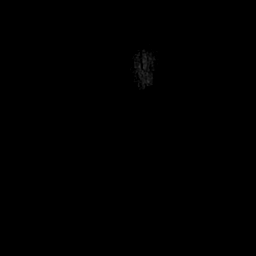

[Series 12: PD fat-sat · coronal · left · 4.0mm · 0.59mm/px · 6 of 28 slices shown (2 of 2)]
[im 1/28]
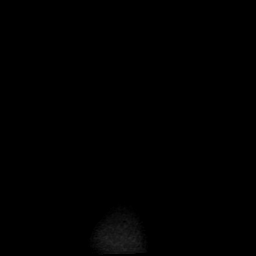
[im 6/28]
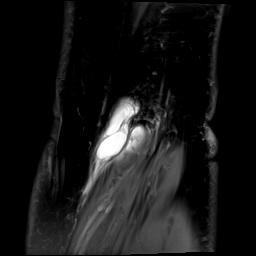
[im 11/28]
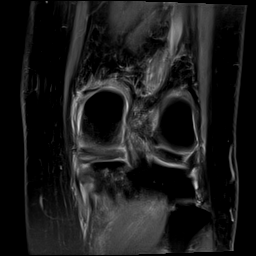
[im 17/28]
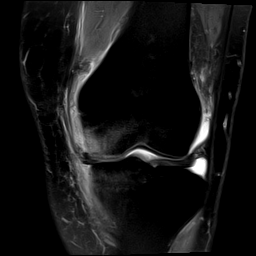
[im 22/28]
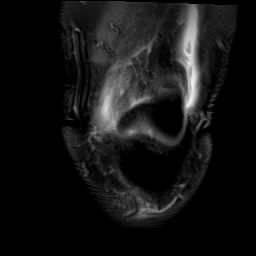
[im 28/28]
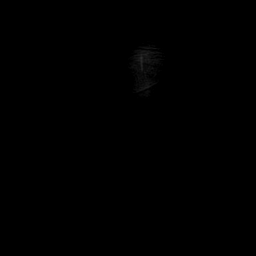

[Series 13: T2 fat-sat · sagittal · left · 3.0mm · 0.59mm/px · 8 of 33 slices shown (3 of 3)]
[im 1/33]
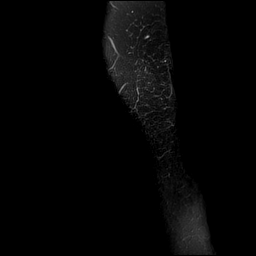
[im 5/33]
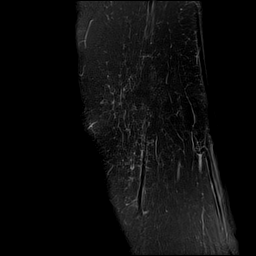
[im 10/33]
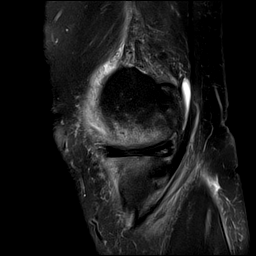
[im 14/33]
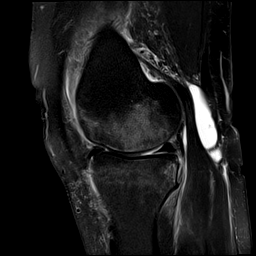
[im 19/33]
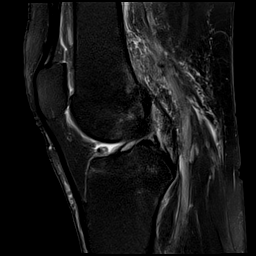
[im 23/33]
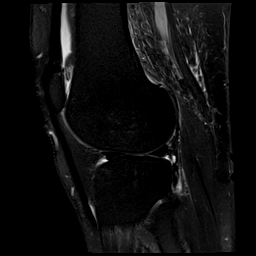
[im 28/33]
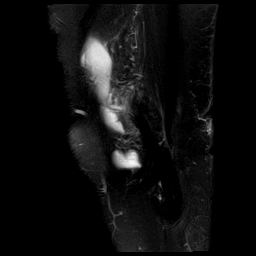
[im 33/33]
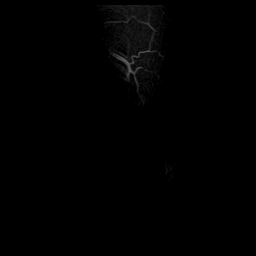

[40 of 40 positions shown; findings below may reference images not displayed]

FINDINGS: MENISCI

Medial meniscus: There is intrinsic degeneration of the posterior
horn. There is suggestion of an incomplete radial tear of the root
of the posterior horn. The meniscus is peripherally extruded.

Lateral meniscus:  Normal.

LIGAMENTS

Cruciates:  Normal.

Collaterals: LCL is normal. There is fluid both superficial and deep
to the MCL with the MCL appears otherwise normal. There is a small
amount of fluid in the pes anserine bursa.

CARTILAGE

Patellofemoral: Small focal area of partial-thickness cartilage loss
of the lateral facet of the patella.

Medial: Diffuse marked thinning of the articular cartilage of the
femoral condyle and tibial plateau.

Lateral:  Diffuse slight thinning of the articular cartilage.

Joint: Small joint effusion. Thickened synovium particularly in the
medial aspect of the joint adjacent to the medial femoral condyle.
No plical thickening. Normal Hoffa's fat pad.

Popliteal Fossa: 4 x 2.5 x 1 cm complex Baker's cyst. The cyst is
leaking and also extends into the proximal calf deep to the fascia
of the medial head of the gastrocnemius, best seen on series 8.

Extensor Mechanism:  Normal.

Bones: There is a focal small subcortical stress fracture of the
medial femoral condyle with a stress reaction in the medial tibial
plateau with secondary bone edema. The edema and fluid adjacent to
the MCL is felt to be secondary to this medial compartment disease.

Other: None
IMPRESSION: 1. Osteoarthritis of the medial compartment with a small subcortical
stress fracture of the medial femoral condyle with secondary bone
edema.
2. Small joint effusion with leaking Baker's cyst.
3. Degeneration of the posterior horn of the medial meniscus with an
incomplete radial tear of the root of the posterior horn.
4. Mild pes anserine bursitis.

## 2020-07-19 DIAGNOSIS — E781 Pure hyperglyceridemia: Secondary | ICD-10-CM | POA: Diagnosis not present

## 2020-07-19 DIAGNOSIS — I129 Hypertensive chronic kidney disease with stage 1 through stage 4 chronic kidney disease, or unspecified chronic kidney disease: Secondary | ICD-10-CM | POA: Diagnosis not present

## 2020-07-19 DIAGNOSIS — N183 Chronic kidney disease, stage 3 unspecified: Secondary | ICD-10-CM | POA: Diagnosis not present

## 2020-07-26 DIAGNOSIS — I129 Hypertensive chronic kidney disease with stage 1 through stage 4 chronic kidney disease, or unspecified chronic kidney disease: Secondary | ICD-10-CM | POA: Diagnosis not present

## 2020-07-26 DIAGNOSIS — N183 Chronic kidney disease, stage 3 unspecified: Secondary | ICD-10-CM | POA: Diagnosis not present

## 2020-07-26 DIAGNOSIS — E538 Deficiency of other specified B group vitamins: Secondary | ICD-10-CM | POA: Diagnosis not present

## 2020-07-26 DIAGNOSIS — K219 Gastro-esophageal reflux disease without esophagitis: Secondary | ICD-10-CM | POA: Diagnosis not present

## 2020-07-26 DIAGNOSIS — Z1231 Encounter for screening mammogram for malignant neoplasm of breast: Secondary | ICD-10-CM | POA: Diagnosis not present

## 2020-07-26 DIAGNOSIS — F325 Major depressive disorder, single episode, in full remission: Secondary | ICD-10-CM | POA: Diagnosis not present

## 2020-07-26 DIAGNOSIS — G43009 Migraine without aura, not intractable, without status migrainosus: Secondary | ICD-10-CM | POA: Diagnosis not present

## 2020-07-26 DIAGNOSIS — E781 Pure hyperglyceridemia: Secondary | ICD-10-CM | POA: Diagnosis not present

## 2020-07-26 DIAGNOSIS — Z0000 Encounter for general adult medical examination without abnormal findings: Secondary | ICD-10-CM | POA: Diagnosis not present

## 2020-08-08 ENCOUNTER — Other Ambulatory Visit: Payer: Self-pay | Admitting: Internal Medicine

## 2020-08-08 DIAGNOSIS — Z1231 Encounter for screening mammogram for malignant neoplasm of breast: Secondary | ICD-10-CM

## 2020-08-09 DIAGNOSIS — D225 Melanocytic nevi of trunk: Secondary | ICD-10-CM | POA: Diagnosis not present

## 2020-08-09 DIAGNOSIS — B078 Other viral warts: Secondary | ICD-10-CM | POA: Diagnosis not present

## 2020-08-09 DIAGNOSIS — X32XXXA Exposure to sunlight, initial encounter: Secondary | ICD-10-CM | POA: Diagnosis not present

## 2020-08-09 DIAGNOSIS — D485 Neoplasm of uncertain behavior of skin: Secondary | ICD-10-CM | POA: Diagnosis not present

## 2020-08-09 DIAGNOSIS — L570 Actinic keratosis: Secondary | ICD-10-CM | POA: Diagnosis not present

## 2020-08-09 DIAGNOSIS — D2261 Melanocytic nevi of right upper limb, including shoulder: Secondary | ICD-10-CM | POA: Diagnosis not present

## 2020-08-09 DIAGNOSIS — Z85828 Personal history of other malignant neoplasm of skin: Secondary | ICD-10-CM | POA: Diagnosis not present

## 2020-08-09 DIAGNOSIS — D2262 Melanocytic nevi of left upper limb, including shoulder: Secondary | ICD-10-CM | POA: Diagnosis not present

## 2020-08-09 DIAGNOSIS — C44619 Basal cell carcinoma of skin of left upper limb, including shoulder: Secondary | ICD-10-CM | POA: Diagnosis not present

## 2020-08-09 DIAGNOSIS — D2272 Melanocytic nevi of left lower limb, including hip: Secondary | ICD-10-CM | POA: Diagnosis not present

## 2020-09-06 ENCOUNTER — Ambulatory Visit
Admission: RE | Admit: 2020-09-06 | Discharge: 2020-09-06 | Disposition: A | Discharge status: Home / Self Care | Payer: PPO | Source: Ambulatory Visit | Attending: Internal Medicine | Admitting: Internal Medicine

## 2020-09-06 ENCOUNTER — Other Ambulatory Visit: Payer: Self-pay

## 2020-09-06 DIAGNOSIS — Z1231 Encounter for screening mammogram for malignant neoplasm of breast: Secondary | ICD-10-CM | POA: Diagnosis not present

## 2020-09-20 DIAGNOSIS — D0462 Carcinoma in situ of skin of left upper limb, including shoulder: Secondary | ICD-10-CM | POA: Diagnosis not present

## 2020-11-29 DIAGNOSIS — H353132 Nonexudative age-related macular degeneration, bilateral, intermediate dry stage: Secondary | ICD-10-CM | POA: Diagnosis not present

## 2021-01-17 DIAGNOSIS — I129 Hypertensive chronic kidney disease with stage 1 through stage 4 chronic kidney disease, or unspecified chronic kidney disease: Secondary | ICD-10-CM | POA: Diagnosis not present

## 2021-01-17 DIAGNOSIS — N183 Chronic kidney disease, stage 3 unspecified: Secondary | ICD-10-CM | POA: Diagnosis not present

## 2021-01-17 DIAGNOSIS — E538 Deficiency of other specified B group vitamins: Secondary | ICD-10-CM | POA: Diagnosis not present

## 2021-01-17 DIAGNOSIS — E781 Pure hyperglyceridemia: Secondary | ICD-10-CM | POA: Diagnosis not present

## 2021-01-24 DIAGNOSIS — J4541 Moderate persistent asthma with (acute) exacerbation: Secondary | ICD-10-CM | POA: Diagnosis not present

## 2021-01-24 DIAGNOSIS — E781 Pure hyperglyceridemia: Secondary | ICD-10-CM | POA: Diagnosis not present

## 2021-01-24 DIAGNOSIS — N183 Chronic kidney disease, stage 3 unspecified: Secondary | ICD-10-CM | POA: Diagnosis not present

## 2021-01-24 DIAGNOSIS — F325 Major depressive disorder, single episode, in full remission: Secondary | ICD-10-CM | POA: Diagnosis not present

## 2021-01-24 DIAGNOSIS — I129 Hypertensive chronic kidney disease with stage 1 through stage 4 chronic kidney disease, or unspecified chronic kidney disease: Secondary | ICD-10-CM | POA: Diagnosis not present

## 2021-06-06 DIAGNOSIS — H353132 Nonexudative age-related macular degeneration, bilateral, intermediate dry stage: Secondary | ICD-10-CM | POA: Diagnosis not present

## 2021-08-01 DIAGNOSIS — N183 Chronic kidney disease, stage 3 unspecified: Secondary | ICD-10-CM | POA: Diagnosis not present

## 2021-08-01 DIAGNOSIS — E781 Pure hyperglyceridemia: Secondary | ICD-10-CM | POA: Diagnosis not present

## 2021-08-01 DIAGNOSIS — I129 Hypertensive chronic kidney disease with stage 1 through stage 4 chronic kidney disease, or unspecified chronic kidney disease: Secondary | ICD-10-CM | POA: Diagnosis not present

## 2021-08-08 DIAGNOSIS — I129 Hypertensive chronic kidney disease with stage 1 through stage 4 chronic kidney disease, or unspecified chronic kidney disease: Secondary | ICD-10-CM | POA: Diagnosis not present

## 2021-08-08 DIAGNOSIS — N183 Chronic kidney disease, stage 3 unspecified: Secondary | ICD-10-CM | POA: Diagnosis not present

## 2021-08-08 DIAGNOSIS — E538 Deficiency of other specified B group vitamins: Secondary | ICD-10-CM | POA: Diagnosis not present

## 2021-08-08 DIAGNOSIS — Z0000 Encounter for general adult medical examination without abnormal findings: Secondary | ICD-10-CM | POA: Diagnosis not present

## 2021-08-08 DIAGNOSIS — E781 Pure hyperglyceridemia: Secondary | ICD-10-CM | POA: Diagnosis not present

## 2021-08-08 DIAGNOSIS — F325 Major depressive disorder, single episode, in full remission: Secondary | ICD-10-CM | POA: Diagnosis not present

## 2021-08-08 DIAGNOSIS — J4541 Moderate persistent asthma with (acute) exacerbation: Secondary | ICD-10-CM | POA: Diagnosis not present

## 2021-09-17 DIAGNOSIS — Z85828 Personal history of other malignant neoplasm of skin: Secondary | ICD-10-CM | POA: Diagnosis not present

## 2021-09-17 DIAGNOSIS — D2262 Melanocytic nevi of left upper limb, including shoulder: Secondary | ICD-10-CM | POA: Diagnosis not present

## 2021-09-17 DIAGNOSIS — D2272 Melanocytic nevi of left lower limb, including hip: Secondary | ICD-10-CM | POA: Diagnosis not present

## 2021-09-17 DIAGNOSIS — L538 Other specified erythematous conditions: Secondary | ICD-10-CM | POA: Diagnosis not present

## 2021-09-17 DIAGNOSIS — R208 Other disturbances of skin sensation: Secondary | ICD-10-CM | POA: Diagnosis not present

## 2021-09-17 DIAGNOSIS — D485 Neoplasm of uncertain behavior of skin: Secondary | ICD-10-CM | POA: Diagnosis not present

## 2021-09-17 DIAGNOSIS — L570 Actinic keratosis: Secondary | ICD-10-CM | POA: Diagnosis not present

## 2021-09-17 DIAGNOSIS — D225 Melanocytic nevi of trunk: Secondary | ICD-10-CM | POA: Diagnosis not present

## 2021-09-17 DIAGNOSIS — C44722 Squamous cell carcinoma of skin of right lower limb, including hip: Secondary | ICD-10-CM | POA: Diagnosis not present

## 2021-09-17 DIAGNOSIS — L820 Inflamed seborrheic keratosis: Secondary | ICD-10-CM | POA: Diagnosis not present

## 2021-11-19 DIAGNOSIS — C44722 Squamous cell carcinoma of skin of right lower limb, including hip: Secondary | ICD-10-CM | POA: Diagnosis not present

## 2021-12-05 DIAGNOSIS — H353132 Nonexudative age-related macular degeneration, bilateral, intermediate dry stage: Secondary | ICD-10-CM | POA: Diagnosis not present

## 2021-12-21 ENCOUNTER — Ambulatory Visit
Admission: EM | Admit: 2021-12-21 | Discharge: 2021-12-21 | Disposition: A | Discharge status: Home / Self Care | Payer: PPO

## 2021-12-21 ENCOUNTER — Encounter: Payer: Self-pay | Admitting: Emergency Medicine

## 2021-12-21 DIAGNOSIS — H6121 Impacted cerumen, right ear: Secondary | ICD-10-CM

## 2021-12-21 DIAGNOSIS — H6122 Impacted cerumen, left ear: Secondary | ICD-10-CM

## 2021-12-21 NOTE — ED Provider Notes (Signed)
[facility]    CSN: [id number] Arrival date & time: 12/21/21  1522      History   Chief Complaint Chief Complaint  Patient presents with   Clogged Ear     HPI NIGERIA LASSETER is a 81 y.o. female.   HPI Patient presents today with cerumen impaction.  She reports awakening this morning with ear fullness and having inability to hear out of her right ear.  She denies any ear pain.  She has used over-the-counter ear lavage drops without any relief of symptoms.  She denies any other associated URI symptoms. 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   Asthma 12/10/2013   Benign hypertension with CKD (chronic kidney disease) stage III ([place]) 12/10/2013   Eczema 12/10/2013   Hypertriglyceridemia 12/10/2013    Past Surgical History:  Procedure Laterality Date   ABDOMINAL HYSTERECTOMY     BREAST BIOPSY Left 08/18/1995   neg/stereotactic biopsy   CATARACT EXTRACTION W/PHACO Right 12/21/2019   Procedure: CATARACT EXTRACTION PHACO AND INTRAOCULAR LENS PLACEMENT (IOC) RIGHT 5.55  00:55.3  10.0%;  Surgeon: Leandrew Koyanagi, MD;  Location: [place];  Service: Ophthalmology;  Laterality: Right;  requests arrival around 10   CATARACT EXTRACTION W/PHACO Left 01/11/2020   Procedure: CATARACT EXTRACTION PHACO AND INTRAOCULAR LENS PLACEMENT ([place]) LEFT;  Surgeon: Leandrew Koyanagi, MD;  Location: [place];  Service: Ophthalmology;  Laterality: Left;  7.18 0:57.0 12.6%   GASTRECTOMY  1968   lower part of stomach and part duodenum ??   KNEE  ARTHROSCOPY Left 07/04/2019   Procedure: ARTHROSCOPY KNEE;  Surgeon: Dereck Leep, MD;  Location: [facility];  Service: Orthopedics;  Laterality: Left;   TONSILLECTOMY      OB History   No obstetric history on file.      Home Medications    Prior to Admission medications   Medication Sig Start Date End Date Taking? Authorizing Provider  acetaminophen (TYLENOL) 500 MG tablet Take 1,000 mg by mouth every 8 (eight) hours as needed (pain).    [provider]  amLODipine-valsartan (EXFORGE) 5-160 MG tablet Take 1 tablet by mouth daily.    [provider]  Ascorbic Acid (VITAMIN C) 500 MG CAPS Take by mouth daily.    [provider]  buPROPion (WELLBUTRIN SR) 150 MG 12 hr tablet Take 150 mg by mouth 2 (two) times daily.    [provider]  Cholecalciferol (D3-1000 PO) Take 1,000 Units by mouth daily.     [provider]  ferrous sulfate 325 (65 FE) MG tablet Take 325 mg by mouth See admin instructions. Mon - Fri    [provider]  Fluticasone-Salmeterol (ADVAIR) 250-50 MCG/DOSE AEPB Inhale 1 puff into the lungs daily as needed (asthma).     [provider]  Magnesium 250 MG TABS Take by mouth daily.    [provider]  montelukast (SINGULAIR) 10 MG tablet Take 10 mg by mouth at bedtime.  [provider]  Multiple Vitamins-Minerals (PRESERVISION AREDS 2 PO) Take by mouth daily.    [provider]  pantoprazole (PROTONIX) 40 MG tablet Take 40 mg by mouth daily.    [provider]  rosuvastatin (CRESTOR) 20 MG tablet Take 20 mg by mouth daily.    [provider]  vitamin B-12 (CYANOCOBALAMIN) 500 MCG tablet Take 500 mcg by mouth daily.    [provider]    Family History Family History  Problem Relation Age of Onset   Breast cancer Neg Hx     Social History Social History   Tobacco Use   Smoking status: Former    Types: Cigarettes    Quit date: 2014    Years since  quitting: 9.4   Smokeless tobacco: Never  Vaping Use   Vaping Use: Never used  Substance Use Topics   Alcohol use: Not Currently   Drug use: Never     Allergies   Cefdinir   Review of Systems Review of Systems Pertinent negatives listed in HPI   Physical Exam Triage Vital Signs ED Triage Vitals  Enc Vitals Group     BP 12/21/21 1549 124/75     Pulse Rate 12/21/21 1549 80     Resp 12/21/21 1549 18     Temp 12/21/21 1549 99.1 F (37.3 C)     Temp Source 12/21/21 1549 Oral     SpO2 12/21/21 1549 95 %     Weight --      Height --      Head Circumference --      Peak Flow --      Pain Score 12/21/21 1552 0     Pain Loc --      Pain Edu? --      Excl. in [place]? --    No data found.  Updated Vital Signs BP 124/75 (BP Location: Left Arm)   Pulse 80   Temp 99.1 F (37.3 C) (Oral)   Resp 18   SpO2 95%   Visual Acuity Right Eye Distance:   Left Eye Distance:   Bilateral Distance:    Right Eye Near:   Left Eye Near:    Bilateral Near:     Physical Exam General Appearance:    Alert, cooperative, no distress  HENT:   Normocephalic,(R)TM skin debris present, (L)TM scant cerumen, no erythema or MEE,  no neck nodes or sinus tenderness  Eyes:    PERRL, conjunctiva/corneas clear, EOM's intact       Lungs:     Clear to auscultation bilaterally, respirations unlabored  Heart:    Regular rate and rhythm  Neurologic:   Awake, alert, oriented x 3. No apparent focal neurological           defect.         UC Treatments / Results  Labs (all labs ordered are listed, but only abnormal results are displayed) Labs Reviewed - No data to display  EKG   Radiology No results found.  Procedures Procedures (including critical care time)  Medications Ordered in UC Medications - No data to display  Initial Impression / Assessment and Plan / UC Course  I have reviewed the triage vital signs and the nursing notes.  Pertinent labs & imaging results that were available during  my care of the patient were reviewed by me and considered in my medical decision making (see chart for details).    Cerumen impaction, ear lavage attempted by RN for right ear,  cerumen unable to be removed. Left ear irrigated by provider, ear wax removed.  Patient tolerated and cerumen successfully removed. Follow-up with ENT pertaining to right ear as cerumen is impacted to the back of the ear and is occluding tympanic membrane.   Follow-up with PCP. Final Clinical Impressions(s) / UC Diagnoses   Final diagnoses:  Impacted cerumen of right ear  Impacted cerumen of left ear   Discharge Instructions   None    ED Prescriptions   None    PDMP not reviewed this encounter.   Scot Jun, FNP 12/25/21 1022

## 2021-12-25 DIAGNOSIS — E781 Pure hyperglyceridemia: Secondary | ICD-10-CM | POA: Diagnosis not present

## 2021-12-25 DIAGNOSIS — I129 Hypertensive chronic kidney disease with stage 1 through stage 4 chronic kidney disease, or unspecified chronic kidney disease: Secondary | ICD-10-CM | POA: Diagnosis not present

## 2021-12-25 DIAGNOSIS — F325 Major depressive disorder, single episode, in full remission: Secondary | ICD-10-CM | POA: Diagnosis not present

## 2021-12-25 DIAGNOSIS — N183 Chronic kidney disease, stage 3 unspecified: Secondary | ICD-10-CM | POA: Diagnosis not present

## 2022-01-08 DIAGNOSIS — H902 Conductive hearing loss, unspecified: Secondary | ICD-10-CM | POA: Diagnosis not present

## 2022-01-08 DIAGNOSIS — H6122 Impacted cerumen, left ear: Secondary | ICD-10-CM | POA: Diagnosis not present

## 2022-01-29 DIAGNOSIS — I129 Hypertensive chronic kidney disease with stage 1 through stage 4 chronic kidney disease, or unspecified chronic kidney disease: Secondary | ICD-10-CM | POA: Diagnosis not present

## 2022-01-29 DIAGNOSIS — E781 Pure hyperglyceridemia: Secondary | ICD-10-CM | POA: Diagnosis not present

## 2022-01-29 DIAGNOSIS — N183 Chronic kidney disease, stage 3 unspecified: Secondary | ICD-10-CM | POA: Diagnosis not present

## 2022-01-29 DIAGNOSIS — E538 Deficiency of other specified B group vitamins: Secondary | ICD-10-CM | POA: Diagnosis not present

## 2022-02-05 DIAGNOSIS — N183 Chronic kidney disease, stage 3 unspecified: Secondary | ICD-10-CM | POA: Diagnosis not present

## 2022-02-05 DIAGNOSIS — F325 Major depressive disorder, single episode, in full remission: Secondary | ICD-10-CM | POA: Diagnosis not present

## 2022-02-05 DIAGNOSIS — E538 Deficiency of other specified B group vitamins: Secondary | ICD-10-CM | POA: Diagnosis not present

## 2022-02-05 DIAGNOSIS — J4520 Mild intermittent asthma, uncomplicated: Secondary | ICD-10-CM | POA: Diagnosis not present

## 2022-02-05 DIAGNOSIS — I129 Hypertensive chronic kidney disease with stage 1 through stage 4 chronic kidney disease, or unspecified chronic kidney disease: Secondary | ICD-10-CM | POA: Diagnosis not present

## 2022-03-24 DIAGNOSIS — C4442 Squamous cell carcinoma of skin of scalp and neck: Secondary | ICD-10-CM | POA: Diagnosis not present

## 2022-03-24 DIAGNOSIS — D485 Neoplasm of uncertain behavior of skin: Secondary | ICD-10-CM | POA: Diagnosis not present

## 2022-03-24 DIAGNOSIS — D2261 Melanocytic nevi of right upper limb, including shoulder: Secondary | ICD-10-CM | POA: Diagnosis not present

## 2022-03-24 DIAGNOSIS — L821 Other seborrheic keratosis: Secondary | ICD-10-CM | POA: Diagnosis not present

## 2022-03-24 DIAGNOSIS — Z85828 Personal history of other malignant neoplasm of skin: Secondary | ICD-10-CM | POA: Diagnosis not present

## 2022-03-24 DIAGNOSIS — D2262 Melanocytic nevi of left upper limb, including shoulder: Secondary | ICD-10-CM | POA: Diagnosis not present

## 2022-03-24 DIAGNOSIS — D0439 Carcinoma in situ of skin of other parts of face: Secondary | ICD-10-CM | POA: Diagnosis not present

## 2022-03-24 DIAGNOSIS — D225 Melanocytic nevi of trunk: Secondary | ICD-10-CM | POA: Diagnosis not present

## 2022-03-24 DIAGNOSIS — L570 Actinic keratosis: Secondary | ICD-10-CM | POA: Diagnosis not present

## 2022-04-28 ENCOUNTER — Ambulatory Visit
Admission: EM | Admit: 2022-04-28 | Discharge: 2022-04-28 | Disposition: A | Discharge status: Home / Self Care | Payer: PPO | Attending: Emergency Medicine | Admitting: Emergency Medicine

## 2022-04-28 ENCOUNTER — Ambulatory Visit (INDEPENDENT_AMBULATORY_CARE_PROVIDER_SITE_OTHER): Payer: PPO

## 2022-04-28 DIAGNOSIS — S20211A Contusion of right front wall of thorax, initial encounter: Secondary | ICD-10-CM | POA: Diagnosis not present

## 2022-04-28 DIAGNOSIS — R0781 Pleurodynia: Secondary | ICD-10-CM

## 2022-04-28 NOTE — Discharge Instructions (Addendum)
Take Tylenol or ibuprofen as needed for discomfort.    Follow up with your primary care provider if your symptoms are not improving.    

## 2022-04-28 NOTE — ED Triage Notes (Signed)
Patient to Urgent Care with complaints of right side pain after an injury that occurred on Friday.  Patient reports that she greeted her neighbors dog who caused her to become off balance and she caught her self on her right side. Reports pain and bruising below her ribs. Describes pain as piercing when she moves.   Has been taking tylenol.

## 2022-04-28 NOTE — ED Provider Notes (Signed)
[facility]    CSN: [id number] Arrival date & time: 04/28/22  1045      History   Chief Complaint Chief Complaint  Patient presents with   Fall    HPI Christina Bird is a 81 y.o. female.  Patient presents with right lower rib pain x 3 days.  The pain started when she stumbled while petting a dog and fell into a metal light pole.  No head injury or loss of consciousness.  No shortness of breath, chest pain, fever, chills, or other symptoms.  The pain is worse with palpation and movement.  Treatment at home with Tylenol.  Her medical history includes asthma, hypertension, CKD.  The history is provided by the patient and medical records.    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   Asthma 12/10/2013   Benign hypertension with CKD (chronic kidney disease) stage III ([place]) 12/10/2013   Eczema 12/10/2013   Hypertriglyceridemia 12/10/2013    Past Surgical History:  Procedure Laterality Date   ABDOMINAL HYSTERECTOMY     BREAST BIOPSY Left 08/18/1995   neg/stereotactic biopsy   CATARACT EXTRACTION W/PHACO Right 12/21/2019   Procedure: CATARACT EXTRACTION PHACO AND INTRAOCULAR LENS PLACEMENT (IOC) RIGHT 5.55  00:55.3  10.0%;  Surgeon: Leandrew Koyanagi, MD;  Location: [place];  Service: Ophthalmology;  Laterality: Right;  requests arrival around 10   CATARACT EXTRACTION W/PHACO Left 01/11/2020   Procedure: CATARACT EXTRACTION PHACO AND INTRAOCULAR LENS PLACEMENT ([place]) LEFT;  Surgeon: Leandrew Koyanagi, MD;  Location: [place];   Service: Ophthalmology;  Laterality: Left;  7.18 0:57.0 12.6%   GASTRECTOMY  1968   lower part of stomach and part duodenum ??   KNEE ARTHROSCOPY Left 07/04/2019   Procedure: ARTHROSCOPY KNEE;  Surgeon: Dereck Leep, MD;  Location: [facility];  Service: Orthopedics;  Laterality: Left;   TONSILLECTOMY      OB History   No obstetric history on file.      Home Medications    Prior to Admission medications   Medication Sig Start Date End Date Taking? Authorizing Provider  acetaminophen (TYLENOL) 500 MG tablet Take 1,000 mg by mouth every 8 (eight) hours as needed (pain).    [provider]  amLODipine-valsartan (EXFORGE) 5-160 MG tablet Take 1 tablet by mouth daily.    [provider]  Ascorbic Acid (VITAMIN C) 500 MG CAPS Take by mouth daily.    [provider]  buPROPion (WELLBUTRIN SR) 150 MG 12 hr tablet Take 150 mg by mouth 2 (two) times daily.    [provider]  Cholecalciferol (D3-1000 PO) Take 1,000 Units by mouth daily.     [provider]  ferrous sulfate 325 (65 FE) MG tablet Take 325 mg by mouth See admin instructions. Mon - Fri    [provider]  Fluticasone-Salmeterol (ADVAIR) 250-50 MCG/DOSE AEPB Inhale 1 puff into the lungs daily as needed (asthma).     [provider]  Magnesium 250 MG TABS Take by mouth daily.    [provider]  montelukast (SINGULAIR) 10 MG tablet Take 10 mg by mouth at bedtime.    [provider]  Multiple Vitamins-Minerals (PRESERVISION AREDS 2 PO) Take by mouth daily.    [provider]  pantoprazole (PROTONIX) 40 MG tablet Take 40 mg by mouth daily.    [provider]  rosuvastatin (CRESTOR) 20 MG tablet Take 20 mg by mouth daily.    [provider]  vitamin B-12 (CYANOCOBALAMIN) 500 MCG tablet Take 500 mcg by mouth daily.    [provider]    Family History Family History  Problem Relation Age of Onset   Breast cancer Neg Hx      Social History Social History   Tobacco Use   Smoking status: Former    Types: Cigarettes    Quit date: 2014    Years since quitting: 9.8   Smokeless tobacco: Never  Vaping Use   Vaping Use: Never used  Substance Use Topics   Alcohol use: Not Currently   Drug use: Never     Allergies   Cefdinir   Review of Systems Review of Systems  Constitutional:  Negative for chills and fever.  Respiratory:  Negative for cough and shortness of breath.   Cardiovascular:  Negative for chest pain and palpitations.  Gastrointestinal:  Negative for abdominal pain.  Musculoskeletal:        Right lower rib pain.  Skin:  Positive for color change. Negative for wound.  All other systems reviewed and are negative.    Physical Exam Triage Vital Signs ED Triage Vitals  Enc Vitals Group     BP 04/28/22 1209 114/73     Pulse Rate 04/28/22 1155 84     Resp 04/28/22 1155 18     Temp 04/28/22 1155 98.1 F (36.7 C)     Temp src --      SpO2 04/28/22 1155 95 %     Weight 04/28/22 1206 145 lb (65.8 kg)     Height 04/28/22 1206 5' 6.5" (1.689 m)     Head Circumference --      Peak Flow --      Pain Score 04/28/22 1205 6     Pain Loc --      Pain Edu? --      Excl. in [place]? --    No data found.  Updated Vital Signs BP 114/73   Pulse 84   Temp 98.1 F (36.7 C)   Resp 18   Ht 5' 6.5" (1.689 m)   Wt 145 lb (65.8 kg)   SpO2 95%   BMI 23.05 kg/m   Visual Acuity Right Eye Distance:   Left Eye Distance:   Bilateral Distance:    Right Eye Near:   Left Eye Near:    Bilateral Near:     Physical Exam Vitals and nursing note reviewed.  Constitutional:      General: She is not in acute distress.    Appearance: Normal appearance. She is well-developed. She is not ill-appearing.  HENT:     Mouth/Throat:     Mouth: Mucous membranes are moist.  Cardiovascular:     Rate and Rhythm: Normal rate and regular rhythm.     Heart sounds: Normal heart sounds.  Pulmonary:     Effort:  Pulmonary effort is normal. No respiratory distress.     Breath sounds: Normal breath sounds.  Musculoskeletal:  General: Tenderness present. No swelling or deformity. Normal range of motion.     Cervical back: Neck supple.     Comments: Right lateral lower rib cage ecchymosis and tenderness to palpation.    Skin:    General: Skin is warm and dry.     Findings: Bruising present. No erythema, lesion or rash.  Neurological:     Mental Status: She is alert.  Psychiatric:        Mood and Affect: Mood normal.        Behavior: Behavior normal.      UC Treatments / Results  Labs (all labs ordered are listed, but only abnormal results are displayed) Labs Reviewed - No data to display  EKG   Radiology DG Ribs Unilateral W/Chest Right  Result Date: 04/28/2022 CLINICAL DATA:  Right rib pain after fall EXAM: RIGHT RIBS AND CHEST - 3+ VIEW COMPARISON:  None Available. FINDINGS: No fracture or other bone lesions are seen involving the ribs. There is no evidence of pneumothorax or pleural effusion. Both lungs are clear. Heart size and mediastinal contours are within normal limits. IMPRESSION: Negative. Electronically Signed   By: Davina Poke D.O.   On: 04/28/2022 12:32    Procedures Procedures (including critical care time)  Medications Ordered in UC Medications - No data to display  Initial Impression / Assessment and Plan / UC Course  I have reviewed the triage vital signs and the nursing notes.  Pertinent labs & imaging results that were available during my care of the patient were reviewed by me and considered in my medical decision making (see chart for details).    Rib pain and contusion.  Xray negative.  Discussed symptomatic treatment with ibuprofen or Tylenol.  Education provided on rib contusion.  Instructed patient to follow up with her PCP if her symptoms are not improving.  She agrees to plan of care.    Final Clinical Impressions(s) / UC Diagnoses   Final  diagnoses:  Rib pain  Contusion of rib on right side, initial encounter     Discharge Instructions      Take Tylenol or ibuprofen as needed for discomfort.  Follow up with your primary care provider if your symptoms are not improving.        ED Prescriptions   None    PDMP not reviewed this encounter.   Sharion Balloon, NP 04/28/22 1240

## 2022-05-13 DIAGNOSIS — D234 Other benign neoplasm of skin of scalp and neck: Secondary | ICD-10-CM | POA: Diagnosis not present

## 2022-05-13 DIAGNOSIS — C4442 Squamous cell carcinoma of skin of scalp and neck: Secondary | ICD-10-CM | POA: Diagnosis not present

## 2022-05-26 DIAGNOSIS — L905 Scar conditions and fibrosis of skin: Secondary | ICD-10-CM | POA: Diagnosis not present

## 2022-05-26 DIAGNOSIS — D0439 Carcinoma in situ of skin of other parts of face: Secondary | ICD-10-CM | POA: Diagnosis not present

## 2022-05-26 DIAGNOSIS — D2339 Other benign neoplasm of skin of other parts of face: Secondary | ICD-10-CM | POA: Diagnosis not present

## 2022-06-06 DIAGNOSIS — H353132 Nonexudative age-related macular degeneration, bilateral, intermediate dry stage: Secondary | ICD-10-CM | POA: Diagnosis not present

## 2022-07-23 DIAGNOSIS — I129 Hypertensive chronic kidney disease with stage 1 through stage 4 chronic kidney disease, or unspecified chronic kidney disease: Secondary | ICD-10-CM | POA: Diagnosis not present

## 2022-07-23 DIAGNOSIS — F325 Major depressive disorder, single episode, in full remission: Secondary | ICD-10-CM | POA: Diagnosis not present

## 2022-07-23 DIAGNOSIS — N183 Chronic kidney disease, stage 3 unspecified: Secondary | ICD-10-CM | POA: Diagnosis not present

## 2022-07-29 DIAGNOSIS — Z961 Presence of intraocular lens: Secondary | ICD-10-CM | POA: Diagnosis not present

## 2022-07-29 DIAGNOSIS — H26492 Other secondary cataract, left eye: Secondary | ICD-10-CM | POA: Diagnosis not present

## 2022-07-29 DIAGNOSIS — H26491 Other secondary cataract, right eye: Secondary | ICD-10-CM | POA: Diagnosis not present

## 2022-07-29 DIAGNOSIS — H353132 Nonexudative age-related macular degeneration, bilateral, intermediate dry stage: Secondary | ICD-10-CM | POA: Diagnosis not present

## 2022-08-06 DIAGNOSIS — N183 Chronic kidney disease, stage 3 unspecified: Secondary | ICD-10-CM | POA: Diagnosis not present

## 2022-08-06 DIAGNOSIS — E538 Deficiency of other specified B group vitamins: Secondary | ICD-10-CM | POA: Diagnosis not present

## 2022-08-06 DIAGNOSIS — I129 Hypertensive chronic kidney disease with stage 1 through stage 4 chronic kidney disease, or unspecified chronic kidney disease: Secondary | ICD-10-CM | POA: Diagnosis not present

## 2022-08-13 DIAGNOSIS — N183 Chronic kidney disease, stage 3 unspecified: Secondary | ICD-10-CM | POA: Diagnosis not present

## 2022-08-13 DIAGNOSIS — J4520 Mild intermittent asthma, uncomplicated: Secondary | ICD-10-CM | POA: Diagnosis not present

## 2022-08-13 DIAGNOSIS — F325 Major depressive disorder, single episode, in full remission: Secondary | ICD-10-CM | POA: Diagnosis not present

## 2022-08-13 DIAGNOSIS — E538 Deficiency of other specified B group vitamins: Secondary | ICD-10-CM | POA: Diagnosis not present

## 2022-08-13 DIAGNOSIS — E781 Pure hyperglyceridemia: Secondary | ICD-10-CM | POA: Diagnosis not present

## 2022-08-13 DIAGNOSIS — I129 Hypertensive chronic kidney disease with stage 1 through stage 4 chronic kidney disease, or unspecified chronic kidney disease: Secondary | ICD-10-CM | POA: Diagnosis not present

## 2022-08-13 DIAGNOSIS — Z0000 Encounter for general adult medical examination without abnormal findings: Secondary | ICD-10-CM | POA: Diagnosis not present

## 2022-08-25 DIAGNOSIS — H26492 Other secondary cataract, left eye: Secondary | ICD-10-CM | POA: Diagnosis not present

## 2022-09-22 DIAGNOSIS — Z872 Personal history of diseases of the skin and subcutaneous tissue: Secondary | ICD-10-CM | POA: Diagnosis not present

## 2022-09-22 DIAGNOSIS — D485 Neoplasm of uncertain behavior of skin: Secondary | ICD-10-CM | POA: Diagnosis not present

## 2022-09-22 DIAGNOSIS — D045 Carcinoma in situ of skin of trunk: Secondary | ICD-10-CM | POA: Diagnosis not present

## 2022-09-22 DIAGNOSIS — D225 Melanocytic nevi of trunk: Secondary | ICD-10-CM | POA: Diagnosis not present

## 2022-09-22 DIAGNOSIS — D2272 Melanocytic nevi of left lower limb, including hip: Secondary | ICD-10-CM | POA: Diagnosis not present

## 2022-09-22 DIAGNOSIS — D2262 Melanocytic nevi of left upper limb, including shoulder: Secondary | ICD-10-CM | POA: Diagnosis not present

## 2022-09-22 DIAGNOSIS — D2261 Melanocytic nevi of right upper limb, including shoulder: Secondary | ICD-10-CM | POA: Diagnosis not present

## 2022-09-22 DIAGNOSIS — L570 Actinic keratosis: Secondary | ICD-10-CM | POA: Diagnosis not present

## 2022-09-22 DIAGNOSIS — Z85828 Personal history of other malignant neoplasm of skin: Secondary | ICD-10-CM | POA: Diagnosis not present

## 2022-09-25 DIAGNOSIS — H26491 Other secondary cataract, right eye: Secondary | ICD-10-CM | POA: Diagnosis not present

## 2022-11-17 DIAGNOSIS — D045 Carcinoma in situ of skin of trunk: Secondary | ICD-10-CM | POA: Diagnosis not present

## 2022-12-09 DIAGNOSIS — H353132 Nonexudative age-related macular degeneration, bilateral, intermediate dry stage: Secondary | ICD-10-CM | POA: Diagnosis not present

## 2022-12-09 DIAGNOSIS — H43813 Vitreous degeneration, bilateral: Secondary | ICD-10-CM | POA: Diagnosis not present

## 2022-12-09 DIAGNOSIS — Z961 Presence of intraocular lens: Secondary | ICD-10-CM | POA: Diagnosis not present

## 2023-02-04 DIAGNOSIS — I129 Hypertensive chronic kidney disease with stage 1 through stage 4 chronic kidney disease, or unspecified chronic kidney disease: Secondary | ICD-10-CM | POA: Diagnosis not present

## 2023-02-04 DIAGNOSIS — N183 Chronic kidney disease, stage 3 unspecified: Secondary | ICD-10-CM | POA: Diagnosis not present

## 2023-02-04 DIAGNOSIS — E781 Pure hyperglyceridemia: Secondary | ICD-10-CM | POA: Diagnosis not present

## 2023-02-11 DIAGNOSIS — N183 Chronic kidney disease, stage 3 unspecified: Secondary | ICD-10-CM | POA: Diagnosis not present

## 2023-02-11 DIAGNOSIS — J4520 Mild intermittent asthma, uncomplicated: Secondary | ICD-10-CM | POA: Diagnosis not present

## 2023-02-11 DIAGNOSIS — R350 Frequency of micturition: Secondary | ICD-10-CM | POA: Diagnosis not present

## 2023-02-11 DIAGNOSIS — E781 Pure hyperglyceridemia: Secondary | ICD-10-CM | POA: Diagnosis not present

## 2023-02-11 DIAGNOSIS — I129 Hypertensive chronic kidney disease with stage 1 through stage 4 chronic kidney disease, or unspecified chronic kidney disease: Secondary | ICD-10-CM | POA: Diagnosis not present

## 2023-02-11 DIAGNOSIS — F325 Major depressive disorder, single episode, in full remission: Secondary | ICD-10-CM | POA: Diagnosis not present

## 2023-06-11 DIAGNOSIS — Z961 Presence of intraocular lens: Secondary | ICD-10-CM | POA: Diagnosis not present

## 2023-06-11 DIAGNOSIS — H02883 Meibomian gland dysfunction of right eye, unspecified eyelid: Secondary | ICD-10-CM | POA: Diagnosis not present

## 2023-06-11 DIAGNOSIS — H353132 Nonexudative age-related macular degeneration, bilateral, intermediate dry stage: Secondary | ICD-10-CM | POA: Diagnosis not present

## 2023-08-11 DIAGNOSIS — E781 Pure hyperglyceridemia: Secondary | ICD-10-CM | POA: Diagnosis not present

## 2023-08-11 DIAGNOSIS — I129 Hypertensive chronic kidney disease with stage 1 through stage 4 chronic kidney disease, or unspecified chronic kidney disease: Secondary | ICD-10-CM | POA: Diagnosis not present

## 2023-08-11 DIAGNOSIS — J4520 Mild intermittent asthma, uncomplicated: Secondary | ICD-10-CM | POA: Diagnosis not present

## 2023-08-11 DIAGNOSIS — N183 Chronic kidney disease, stage 3 unspecified: Secondary | ICD-10-CM | POA: Diagnosis not present

## 2023-08-18 DIAGNOSIS — E781 Pure hyperglyceridemia: Secondary | ICD-10-CM | POA: Diagnosis not present

## 2023-08-18 DIAGNOSIS — E538 Deficiency of other specified B group vitamins: Secondary | ICD-10-CM | POA: Diagnosis not present

## 2023-08-18 DIAGNOSIS — I129 Hypertensive chronic kidney disease with stage 1 through stage 4 chronic kidney disease, or unspecified chronic kidney disease: Secondary | ICD-10-CM | POA: Diagnosis not present

## 2023-08-18 DIAGNOSIS — N183 Chronic kidney disease, stage 3 unspecified: Secondary | ICD-10-CM | POA: Diagnosis not present

## 2023-08-18 DIAGNOSIS — J4520 Mild intermittent asthma, uncomplicated: Secondary | ICD-10-CM | POA: Diagnosis not present

## 2023-08-18 DIAGNOSIS — F325 Major depressive disorder, single episode, in full remission: Secondary | ICD-10-CM | POA: Diagnosis not present

## 2023-08-18 DIAGNOSIS — Z0000 Encounter for general adult medical examination without abnormal findings: Secondary | ICD-10-CM | POA: Diagnosis not present

## 2023-12-17 DIAGNOSIS — Z961 Presence of intraocular lens: Secondary | ICD-10-CM | POA: Diagnosis not present

## 2023-12-17 DIAGNOSIS — H43813 Vitreous degeneration, bilateral: Secondary | ICD-10-CM | POA: Diagnosis not present

## 2023-12-17 DIAGNOSIS — H353132 Nonexudative age-related macular degeneration, bilateral, intermediate dry stage: Secondary | ICD-10-CM | POA: Diagnosis not present

## 2024-01-05 DIAGNOSIS — L218 Other seborrheic dermatitis: Secondary | ICD-10-CM | POA: Diagnosis not present

## 2024-01-05 DIAGNOSIS — L570 Actinic keratosis: Secondary | ICD-10-CM | POA: Diagnosis not present

## 2024-01-05 DIAGNOSIS — D2272 Melanocytic nevi of left lower limb, including hip: Secondary | ICD-10-CM | POA: Diagnosis not present

## 2024-01-05 DIAGNOSIS — D485 Neoplasm of uncertain behavior of skin: Secondary | ICD-10-CM | POA: Diagnosis not present

## 2024-01-05 DIAGNOSIS — Z85828 Personal history of other malignant neoplasm of skin: Secondary | ICD-10-CM | POA: Diagnosis not present

## 2024-01-05 DIAGNOSIS — L820 Inflamed seborrheic keratosis: Secondary | ICD-10-CM | POA: Diagnosis not present

## 2024-01-05 DIAGNOSIS — D2261 Melanocytic nevi of right upper limb, including shoulder: Secondary | ICD-10-CM | POA: Diagnosis not present

## 2024-01-05 DIAGNOSIS — D225 Melanocytic nevi of trunk: Secondary | ICD-10-CM | POA: Diagnosis not present

## 2024-01-05 DIAGNOSIS — D2262 Melanocytic nevi of left upper limb, including shoulder: Secondary | ICD-10-CM | POA: Diagnosis not present

## 2024-02-09 DIAGNOSIS — E538 Deficiency of other specified B group vitamins: Secondary | ICD-10-CM | POA: Diagnosis not present

## 2024-02-09 DIAGNOSIS — N183 Chronic kidney disease, stage 3 unspecified: Secondary | ICD-10-CM | POA: Diagnosis not present

## 2024-02-09 DIAGNOSIS — I129 Hypertensive chronic kidney disease with stage 1 through stage 4 chronic kidney disease, or unspecified chronic kidney disease: Secondary | ICD-10-CM | POA: Diagnosis not present

## 2024-02-09 DIAGNOSIS — E781 Pure hyperglyceridemia: Secondary | ICD-10-CM | POA: Diagnosis not present

## 2024-02-16 DIAGNOSIS — E538 Deficiency of other specified B group vitamins: Secondary | ICD-10-CM | POA: Diagnosis not present

## 2024-02-16 DIAGNOSIS — E781 Pure hyperglyceridemia: Secondary | ICD-10-CM | POA: Diagnosis not present

## 2024-02-16 DIAGNOSIS — F325 Major depressive disorder, single episode, in full remission: Secondary | ICD-10-CM | POA: Diagnosis not present

## 2024-02-16 DIAGNOSIS — I129 Hypertensive chronic kidney disease with stage 1 through stage 4 chronic kidney disease, or unspecified chronic kidney disease: Secondary | ICD-10-CM | POA: Diagnosis not present

## 2024-02-16 DIAGNOSIS — R799 Abnormal finding of blood chemistry, unspecified: Secondary | ICD-10-CM | POA: Diagnosis not present

## 2024-02-16 DIAGNOSIS — N183 Chronic kidney disease, stage 3 unspecified: Secondary | ICD-10-CM | POA: Diagnosis not present

## 2024-02-16 DIAGNOSIS — J4520 Mild intermittent asthma, uncomplicated: Secondary | ICD-10-CM | POA: Diagnosis not present

## 2024-05-13 ENCOUNTER — Emergency Department

## 2024-05-13 ENCOUNTER — Other Ambulatory Visit: Payer: Self-pay

## 2024-05-13 ENCOUNTER — Emergency Department
Admission: EM | Admit: 2024-05-13 | Discharge: 2024-05-13 | Disposition: A | Discharge status: Home / Self Care | Attending: Emergency Medicine | Admitting: Emergency Medicine

## 2024-05-13 DIAGNOSIS — W19XXXA Unspecified fall, initial encounter: Secondary | ICD-10-CM | POA: Insufficient documentation

## 2024-05-13 DIAGNOSIS — M4802 Spinal stenosis, cervical region: Secondary | ICD-10-CM | POA: Diagnosis not present

## 2024-05-13 DIAGNOSIS — E860 Dehydration: Secondary | ICD-10-CM | POA: Diagnosis not present

## 2024-05-13 DIAGNOSIS — Z23 Encounter for immunization: Secondary | ICD-10-CM | POA: Diagnosis not present

## 2024-05-13 DIAGNOSIS — S0181XA Laceration without foreign body of other part of head, initial encounter: Secondary | ICD-10-CM | POA: Insufficient documentation

## 2024-05-13 DIAGNOSIS — M5032 Other cervical disc degeneration, mid-cervical region, unspecified level: Secondary | ICD-10-CM | POA: Diagnosis not present

## 2024-05-13 DIAGNOSIS — S0990XA Unspecified injury of head, initial encounter: Secondary | ICD-10-CM

## 2024-05-13 DIAGNOSIS — R55 Syncope and collapse: Secondary | ICD-10-CM | POA: Diagnosis not present

## 2024-05-13 DIAGNOSIS — M47812 Spondylosis without myelopathy or radiculopathy, cervical region: Secondary | ICD-10-CM | POA: Diagnosis not present

## 2024-05-13 DIAGNOSIS — S199XXA Unspecified injury of neck, initial encounter: Secondary | ICD-10-CM | POA: Diagnosis not present

## 2024-05-13 LAB — CBC
HCT: 31.7 % — ABNORMAL LOW (ref 36.0–46.0)
Hemoglobin: 10.6 g/dL — ABNORMAL LOW (ref 12.0–15.0)
MCH: 31.8 pg (ref 26.0–34.0)
MCHC: 33.4 g/dL (ref 30.0–36.0)
MCV: 95.2 fL (ref 80.0–100.0)
Platelets: 182 K/uL (ref 150–400)
RBC: 3.33 MIL/uL — ABNORMAL LOW (ref 3.87–5.11)
RDW: 12.4 % (ref 11.5–15.5)
WBC: 9.6 K/uL (ref 4.0–10.5)
nRBC: 0 % (ref 0.0–0.2)

## 2024-05-13 LAB — TROPONIN T, HIGH SENSITIVITY
Troponin T High Sensitivity: 19 ng/L (ref 0–19)
Troponin T High Sensitivity: 17 ng/L (ref 0–19)

## 2024-05-13 LAB — BASIC METABOLIC PANEL WITH GFR
Anion gap: 13 (ref 5–15)
BUN: 25 mg/dL — ABNORMAL HIGH (ref 8–23)
CO2: 23 mmol/L (ref 22–32)
Calcium: 9 mg/dL (ref 8.9–10.3)
Chloride: 99 mmol/L (ref 98–111)
Creatinine, Ser: 1.15 mg/dL — ABNORMAL HIGH (ref 0.44–1.00)
GFR, Estimated: 47 mL/min — ABNORMAL LOW (ref >60)
Glucose, Bld: 112 mg/dL — ABNORMAL HIGH (ref 70–99)
Potassium: 4.3 mmol/L (ref 3.5–5.1)
Sodium: 135 mmol/L (ref 135–145)

## 2024-05-13 MED ORDER — LIDOCAINE-EPINEPHRINE 1 %-1:100000 IJ SOLN
10.0000 mL | Freq: Once | INTRAMUSCULAR | Status: AC
  Administered 2024-05-13: 10 mL
  Filled 2024-05-13: qty 1

## 2024-05-13 MED ORDER — SODIUM CHLORIDE 0.9 % IV BOLUS
500.0000 mL | Freq: Once | INTRAVENOUS | Status: AC
  Administered 2024-05-13: 500 mL via INTRAVENOUS

## 2024-05-13 MED ORDER — TETANUS-DIPHTH-ACELL PERTUSSIS 5-2-15.5 LF-MCG/0.5 IM SUSP
0.5000 mL | Freq: Once | INTRAMUSCULAR | Status: AC
  Administered 2024-05-13: 0.5 mL via INTRAMUSCULAR
  Filled 2024-05-13: qty 0.5

## 2024-05-13 NOTE — ED Notes (Deleted)
 Called to [facility] per RN Candida to Activate Code Stroke @756pm Christina Bird.

## 2024-05-13 NOTE — ED Provider Notes (Signed)
 [facility] Provider Note    Event Date/Time   First MD Initiated Contact with Patient 05/13/24 2113     (approximate)   History   Fall   HPI  Christina Bird is a 83 y.o. female who presents after a fall which occurred approximately 24 hours ago which has completely resolved today.  She reports she feels well and has no complaints.  When daughter arrived to check on the patient she was shocked at the laceration to the forehead.  No other injuries reported     Physical Exam   Triage Vital Signs: ED Triage Vitals  Encounter Vitals Group     BP 05/13/24 1909 135/70     Girls Systolic BP Percentile --      Girls Diastolic BP Percentile --      Boys Systolic BP Percentile --      Boys Diastolic BP Percentile --      Pulse Rate 05/13/24 1909 93     Resp 05/13/24 1909 18     Temp 05/13/24 1909 98.4 F (36.9 C)     Temp Source 05/13/24 1909 Oral     SpO2 05/13/24 1909 97 %     Weight 05/13/24 1916 65.8 kg (145 lb)     Height 05/13/24 1916 1.702 m (5' 7)     Head Circumference --      Peak Flow --      Pain Score 05/13/24 1917 0     Pain Loc --      Pain Education --      Exclude from Growth Chart --     Most recent vital signs: Vitals:   05/13/24 1909  BP: 135/70  Pulse: 93  Resp: 18  Temp: 98.4 F (36.9 C)  SpO2: 97%     General: Awake, no distress.  Dry mucous membrane CV:  Good peripheral perfusion.  Resp:  Normal effort.  Abd:  No distention.  Other:  Angulated laceration to the forehead approximately 6 cm, no bleeding Normal neurologic exam, normal range of motion of all extremity   ED Results / Procedures / Treatments   Labs (all labs ordered are listed, but only abnormal results are displayed) Labs Reviewed  BASIC METABOLIC PANEL WITH GFR - Abnormal; Notable for the following components:      Result Value   Glucose, Bld 112 (*)    BUN 25 (*)    Creatinine, Ser 1.15 (*)    GFR, Estimated 47 (*)    All other  components within normal limits  CBC - Abnormal; Notable for the following components:   RBC 3.33 (*)    Hemoglobin 10.6 (*)    HCT 31.7 (*)    All other components within normal limits  TROPONIN T, HIGH SENSITIVITY  TROPONIN T, HIGH SENSITIVITY     EKG ED ECG REPORT I, Lamar Price, the attending physician, personally viewed and interpreted this ECG.  Date: 05/13/2024  Rhythm: normal sinus rhythm QRS Axis: normal Intervals: normal ST/T Wave abnormalities: normal Narrative Interpretation: no evidence of acute ischemia     RADIOLOGY CT head viewed interpret by me, no acute abnormality, confirmed by radiology    PROCEDURES:  Critical Care performed:   Procedures   MEDICATIONS ORDERED IN ED: Medications  sodium chloride 0.9 % bolus 500 mL (has no administration in time range)     IMPRESSION / MDM / ASSESSMENT AND PLAN / ED COURSE  I reviewed the triage vital signs and the  nursing notes. Patient's presentation is most consistent with acute presentation with potential threat to life or bodily function.  Patient presents after fall with significant laceration to the forehead/head injury.  Differential includes laceration, concussion, minor head injury, skull fracture, ICH, dehydration  CT head and cervical spine are reassuring, chest x-ray without acute abnormality, lab work overall reassuring, mild dehydration suspected, high sensitive troponin is reassuring  Will give IV fluids for mild dehydration  PA to repair laceration despite it being nearly 24 hours old given that it is significantly gaping, have discussed with the patient the increased infection risk from this and she understands      FINAL CLINICAL IMPRESSION(S) / ED DIAGNOSES   Final diagnoses:  Fall, initial encounter  Injury of head, initial encounter  Dehydration  Forehead laceration, initial encounter     Rx / DC Orders   ED Discharge Orders     None        Note:  This document  was prepared using Dragon voice recognition software and may include unintentional dictation errors.   Arlander Charleston, MD 05/13/24 2131

## 2024-05-13 NOTE — ED Provider Notes (Signed)
  [place] [facility] AT [facility] Provider Note   CSN: [id number] Arrival date & time: 05/13/24  8095     Patient presents with: Christina Bird   [name] is a 83 y.o. female presents to the emergency department for fall/forehead laceration.  She has had negative workup consisting of CT head, cervical spine, chest x-ray, BMP, CBC, troponin, EKG.  Tetanus has been updated.  Laceration repair discussed with the patient and procedure performed.  She is educated on wound care and follow-up she understands signs symptoms return to the ER for.    .Laceration Repair  Date/Time: 05/13/2024 10:59 PM  Performed by: Charlene Debby BROCKS, PA-C Authorized by: Charlene Debby BROCKS, PA-C   Consent:    Consent obtained:  Verbal   Consent given by:  Patient   Risks discussed:  Infection, need for additional repair, nerve damage and vascular damage   Alternatives discussed:  No treatment and delayed treatment Universal protocol:    Imaging studies available: yes     Patient identity confirmed:  Verbally with patient Anesthesia:    Anesthesia method:  Local infiltration   Local anesthetic:  Lidocaine  1% WITH epi Laceration details:    Location:  Face   Face location:  Forehead   Length (cm):  7   Depth (mm):  5 Exploration:    Limited defect created (wound extended): yes     Wound exploration: entire depth of wound visualized     Contaminated: no   Treatment:    Area cleansed with:  Povidone-iodine and saline   Amount of cleaning:  Extensive   Irrigation volume:  60   Irrigation method:  Syringe, pressure wash and tap Skin repair:    Repair method:  Sutures   Suture size:  4-0   Suture material:  Fast-absorbing gut   Suture technique:  Subcuticular   Number of sutures:  7 Approximation:    Approximation:  Close Repair type:    Repair type:  Simple Post-procedure details:    Dressing:  Open (no dressing)   Procedure completion:  Tolerated     Charlene Debby BROCKS,  PA-C 05/13/24 2305    Arlander Charleston, MD 05/13/24 2312

## 2024-05-13 NOTE — Discharge Instructions (Addendum)
 Weight 24 hours before showering and getting laceration site wet.  After 24 hours you can shower but do not scrub or rub forehead for 1 week.  Allow Dermabond to come off on its own.  Return to the ER for any increasing pain swelling warmth redness or any urgent changes in your health.

## 2024-05-13 NOTE — ED Triage Notes (Signed)
 Pt arrived to ED d/t fall last night where she began throwing up after eating and upon going to the restroom she states she thinks she passed out and doesn't remember what she hit. Upon triage, she arrives with a 2.5 in laceration with bleeding controlled. - Blood thinners. No blurry vision, no dizziness.

## 2024-05-23 DIAGNOSIS — W1800XA Striking against unspecified object with subsequent fall, initial encounter: Secondary | ICD-10-CM | POA: Diagnosis not present

## 2024-05-23 DIAGNOSIS — K591 Functional diarrhea: Secondary | ICD-10-CM | POA: Diagnosis not present
